# Patient Record
Sex: Female | Born: 1995 | Race: White | Hispanic: No | Marital: Single | Smoking: Current every day smoker
Health system: Southern US, Community
[De-identification: ages and names within clinical notes are randomized; demographics above are authoritative.]

## PROBLEM LIST (undated history)

## (undated) DIAGNOSIS — N83209 Unspecified ovarian cyst, unspecified side: Secondary | ICD-10-CM

## (undated) HISTORY — PX: WISDOM TOOTH EXTRACTION: SHX21

## (undated) HISTORY — DX: Unspecified ovarian cyst, unspecified side: N83.209

---

## 2014-09-06 ENCOUNTER — Observation Stay (HOSPITAL_COMMUNITY)
Admission: EM | Admit: 2014-09-06 | Discharge: 2014-09-07 | Disposition: A | Discharge status: Other Acute Inpatient Hospital | Payer: BLUE CROSS/BLUE SHIELD | Attending: Obstetrics & Gynecology | Admitting: Obstetrics & Gynecology

## 2014-09-06 ENCOUNTER — Encounter (HOSPITAL_COMMUNITY): Payer: Self-pay | Admitting: Emergency Medicine

## 2014-09-06 DIAGNOSIS — K659 Peritonitis, unspecified: Secondary | ICD-10-CM

## 2014-09-06 DIAGNOSIS — R1031 Right lower quadrant pain: Secondary | ICD-10-CM | POA: Diagnosis present

## 2014-09-06 DIAGNOSIS — R112 Nausea with vomiting, unspecified: Secondary | ICD-10-CM | POA: Insufficient documentation

## 2014-09-06 DIAGNOSIS — R102 Pelvic and perineal pain: Secondary | ICD-10-CM

## 2014-09-06 DIAGNOSIS — N83209 Unspecified ovarian cyst, unspecified side: Secondary | ICD-10-CM | POA: Diagnosis present

## 2014-09-06 DIAGNOSIS — R000 Tachycardia, unspecified: Secondary | ICD-10-CM

## 2014-09-06 LAB — CBC WITH DIFFERENTIAL/PLATELET
Basophils Absolute: 0.2 10*3/uL — ABNORMAL HIGH (ref 0.0–0.1)
Basophils Relative: 1 % (ref 0–1)
EOS ABS: 0 10*3/uL (ref 0.0–0.7)
EOS PCT: 0 % (ref 0–5)
HCT: 39.3 % (ref 36.0–46.0)
Hemoglobin: 13.5 g/dL (ref 12.0–15.0)
LYMPHS ABS: 2.9 10*3/uL (ref 0.7–4.0)
Lymphocytes Relative: 13 % (ref 12–46)
MCH: 32.2 pg (ref 26.0–34.0)
MCHC: 34.4 g/dL (ref 30.0–36.0)
MCV: 93.8 fL (ref 78.0–100.0)
MONO ABS: 2 10*3/uL — AB (ref 0.1–1.0)
Monocytes Relative: 9 % (ref 3–12)
NEUTROS PCT: 77 % (ref 43–77)
Neutro Abs: 17 10*3/uL — ABNORMAL HIGH (ref 1.7–7.7)
Platelets: 249 10*3/uL (ref 150–400)
RBC: 4.19 MIL/uL (ref 3.87–5.11)
RDW: 12.3 % (ref 11.5–15.5)
WBC: 22.1 10*3/uL — ABNORMAL HIGH (ref 4.0–10.5)

## 2014-09-06 LAB — COMPREHENSIVE METABOLIC PANEL
ALBUMIN: 4.9 g/dL (ref 3.5–5.2)
ALT: 11 U/L (ref 0–35)
AST: 21 U/L (ref 0–37)
Alkaline Phosphatase: 39 U/L (ref 39–117)
Anion gap: 12 (ref 5–15)
BILIRUBIN TOTAL: 0.6 mg/dL (ref 0.3–1.2)
BUN: 8 mg/dL (ref 6–23)
CHLORIDE: 103 mmol/L (ref 96–112)
CO2: 24 mmol/L (ref 19–32)
Calcium: 9.3 mg/dL (ref 8.4–10.5)
Creatinine, Ser: 0.7 mg/dL (ref 0.50–1.10)
GFR calc Af Amer: >90 mL/min (ref >90)
Glucose, Bld: 116 mg/dL — ABNORMAL HIGH (ref 70–99)
POTASSIUM: 3.4 mmol/L — AB (ref 3.5–5.1)
Sodium: 139 mmol/L (ref 135–145)
Total Protein: 8 g/dL (ref 6.0–8.3)

## 2014-09-06 LAB — POC URINE PREG, ED: PREG TEST UR: NEGATIVE

## 2014-09-06 LAB — LIPASE, BLOOD: LIPASE: 17 U/L (ref 11–59)

## 2014-09-06 NOTE — ED Notes (Signed)
Pt reports N/V onset yesterday. Today pt began to have RLQ abdominal pain and continued N/V.

## 2014-09-06 NOTE — ED Notes (Signed)
Pt. Made aware for the need of urine. 

## 2014-09-07 ENCOUNTER — Encounter (HOSPITAL_COMMUNITY): Payer: Self-pay

## 2014-09-07 ENCOUNTER — Inpatient Hospital Stay (HOSPITAL_COMMUNITY): Payer: BLUE CROSS/BLUE SHIELD

## 2014-09-07 ENCOUNTER — Emergency Department (HOSPITAL_COMMUNITY): Payer: BLUE CROSS/BLUE SHIELD

## 2014-09-07 DIAGNOSIS — N8320 Unspecified ovarian cysts: Secondary | ICD-10-CM

## 2014-09-07 DIAGNOSIS — N83209 Unspecified ovarian cyst, unspecified side: Secondary | ICD-10-CM | POA: Diagnosis present

## 2014-09-07 LAB — URINALYSIS, ROUTINE W REFLEX MICROSCOPIC
BILIRUBIN URINE: NEGATIVE
Glucose, UA: NEGATIVE mg/dL
KETONES UR: 15 mg/dL — AB
Nitrite: NEGATIVE
PROTEIN: NEGATIVE mg/dL
SPECIFIC GRAVITY, URINE: 1.014 (ref 1.005–1.030)
Urobilinogen, UA: 0.2 mg/dL (ref 0.0–1.0)
pH: 5.5 (ref 5.0–8.0)

## 2014-09-07 LAB — CBC
HCT: 32.3 % — ABNORMAL LOW (ref 36.0–46.0)
HCT: 27.5 % — ABNORMAL LOW (ref 36.0–46.0)
HEMOGLOBIN: 11.1 g/dL — AB (ref 12.0–15.0)
Hemoglobin: 9.4 g/dL — ABNORMAL LOW (ref 12.0–15.0)
MCH: 31.8 pg (ref 26.0–34.0)
MCH: 31.6 pg (ref 26.0–34.0)
MCHC: 34.4 g/dL (ref 30.0–36.0)
MCHC: 34.2 g/dL (ref 30.0–36.0)
MCV: 92.6 fL (ref 78.0–100.0)
MCV: 92.6 fL (ref 78.0–100.0)
PLATELETS: 201 10*3/uL (ref 150–400)
PLATELETS: 165 10*3/uL (ref 150–400)
RBC: 3.49 MIL/uL — AB (ref 3.87–5.11)
RBC: 2.97 MIL/uL — ABNORMAL LOW (ref 3.87–5.11)
RDW: 12.5 % (ref 11.5–15.5)
RDW: 12.4 % (ref 11.5–15.5)
WBC: 17.1 10*3/uL — AB (ref 4.0–10.5)
WBC: 11.4 10*3/uL — ABNORMAL HIGH (ref 4.0–10.5)

## 2014-09-07 LAB — CBC WITH DIFFERENTIAL/PLATELET
Basophils Absolute: 0 10*3/uL (ref 0.0–0.1)
Basophils Relative: 0 % (ref 0–1)
Eosinophils Absolute: 0 10*3/uL (ref 0.0–0.7)
Eosinophils Relative: 0 % (ref 0–5)
HCT: 35.7 % — ABNORMAL LOW (ref 36.0–46.0)
Hemoglobin: 12.3 g/dL (ref 12.0–15.0)
Lymphocytes Relative: 5 % — ABNORMAL LOW (ref 12–46)
Lymphs Abs: 1.1 10*3/uL (ref 0.7–4.0)
MCH: 31.9 pg (ref 26.0–34.0)
MCHC: 34.5 g/dL (ref 30.0–36.0)
MCV: 92.7 fL (ref 78.0–100.0)
Monocytes Absolute: 1.1 10*3/uL — ABNORMAL HIGH (ref 0.1–1.0)
Monocytes Relative: 5 % (ref 3–12)
Neutro Abs: 19.9 10*3/uL — ABNORMAL HIGH (ref 1.7–7.7)
Neutrophils Relative %: 90 % — ABNORMAL HIGH (ref 43–77)
Platelets: 226 10*3/uL (ref 150–400)
RBC: 3.85 MIL/uL — ABNORMAL LOW (ref 3.87–5.11)
RDW: 12.3 % (ref 11.5–15.5)
WBC: 22.1 10*3/uL — ABNORMAL HIGH (ref 4.0–10.5)

## 2014-09-07 LAB — WET PREP, GENITAL
Clue Cells Wet Prep HPF POC: NONE SEEN
Trich, Wet Prep: NONE SEEN
Yeast Wet Prep HPF POC: NONE SEEN

## 2014-09-07 LAB — URINE MICROSCOPIC-ADD ON

## 2014-09-07 LAB — RPR: RPR: NONREACTIVE

## 2014-09-07 LAB — I-STAT BETA HCG BLOOD, ED (MC, WL, AP ONLY): I-stat hCG, quantitative: <5 m[IU]/mL (ref <5)

## 2014-09-07 LAB — HIV ANTIBODY (ROUTINE TESTING W REFLEX): HIV Screen 4th Generation wRfx: NONREACTIVE

## 2014-09-07 MED ORDER — OXYCODONE-ACETAMINOPHEN 5-325 MG PO TABS: 1.0000 | ORAL_TABLET | ORAL | Status: DC | PRN

## 2014-09-07 MED ORDER — OXYCODONE-ACETAMINOPHEN 5-325 MG PO TABS
1.0000 | ORAL_TABLET | ORAL | Status: DC | PRN
  Administered 2014-09-07 (×4): 1 via ORAL
  Filled 2014-09-07 (×4): qty 1

## 2014-09-07 MED ORDER — FENTANYL CITRATE 0.05 MG/ML IJ SOLN
25.0000 ug | Freq: Once | INTRAMUSCULAR | Status: AC
  Administered 2014-09-07: 25 ug via INTRAVENOUS
  Filled 2014-09-07: qty 2

## 2014-09-07 MED ORDER — ONDANSETRON HCL 4 MG PO TABS
4.0000 mg | ORAL_TABLET | Freq: Four times a day (QID) | ORAL | Status: DC | PRN
  Administered 2014-09-07: 4 mg via ORAL
  Filled 2014-09-07: qty 1

## 2014-09-07 MED ORDER — NORETHINDRONE ACET-ETHINYL EST 1-20 MG-MCG PO TABS: 1.0000 | ORAL_TABLET | Freq: Every day | ORAL | Status: DC

## 2014-09-07 MED ORDER — LACTATED RINGERS IV SOLN
INTRAVENOUS | Status: DC
  Administered 2014-09-07: 06:00:00 via INTRAVENOUS

## 2014-09-07 MED ORDER — ONDANSETRON HCL 4 MG/2ML IJ SOLN
4.0000 mg | Freq: Once | INTRAMUSCULAR | Status: AC
  Administered 2014-09-07: 4 mg via INTRAVENOUS
  Filled 2014-09-07: qty 2

## 2014-09-07 MED ORDER — IOHEXOL 300 MG/ML  SOLN
80.0000 mL | Freq: Once | INTRAMUSCULAR | Status: AC | PRN
  Administered 2014-09-07: 80 mL via INTRAVENOUS

## 2014-09-07 MED ORDER — DOCUSATE SODIUM 100 MG PO CAPS: 100.0000 mg | ORAL_CAPSULE | Freq: Two times a day (BID) | ORAL | Status: DC | PRN

## 2014-09-07 MED ORDER — ONDANSETRON HCL 4 MG/2ML IJ SOLN: 4.0000 mg | Freq: Four times a day (QID) | INTRAMUSCULAR | Status: DC | PRN

## 2014-09-07 MED ORDER — IBUPROFEN 400 MG PO TABS
400.0000 mg | ORAL_TABLET | Freq: Four times a day (QID) | ORAL | Status: DC | PRN
Start: 2014-09-07 — End: 2014-10-03

## 2014-09-07 MED ORDER — HYDROMORPHONE HCL 1 MG/ML IJ SOLN: 1.0000 mg | INTRAMUSCULAR | Status: DC | PRN

## 2014-09-07 MED ORDER — HYDROMORPHONE HCL 1 MG/ML IJ SOLN
1.0000 mg | Freq: Once | INTRAMUSCULAR | Status: AC
  Administered 2014-09-07: 1 mg via INTRAVENOUS
  Filled 2014-09-07: qty 1

## 2014-09-07 MED ORDER — SODIUM CHLORIDE 0.9 % IV BOLUS (SEPSIS)
1000.0000 mL | Freq: Once | INTRAVENOUS | Status: AC
  Administered 2014-09-07: 1000 mL via INTRAVENOUS

## 2014-09-07 MED ORDER — ACETAMINOPHEN 325 MG PO TABS: 650.0000 mg | ORAL_TABLET | ORAL | Status: DC | PRN

## 2014-09-07 MED ORDER — IOHEXOL 300 MG/ML  SOLN
50.0000 mL | Freq: Once | INTRAMUSCULAR | Status: AC | PRN
  Administered 2014-09-07: 50 mL via ORAL

## 2014-09-07 NOTE — ED Provider Notes (Signed)
Medical screening examination/treatment/procedure(s) were conducted as a shared visit with non-physician practitioner(s) and myself.  I personally evaluated the patient during the encounter.  Patient with peritonitis on examination.  CT scan consistent with large volume fluid likely blood from hemorrhagic right ovarian cyst.  Given her para his niacin exam in her ongoing tachycardia I think she would benefit from observation [facility] with gyne[facility] admission.  I'm concerned that she still has active bleeding and may benefit from operative management.  I discussed the case with Dr. Despina HiddenEure who accepts the patient to the [facility] MAU MR evaluated there.  Repeat hemoglobin ordered  CRITICAL CARE Performed by: Lyanne CoAMPOS,Malikai Gut M Total critical care time: 30 Critical care time was exclusive of separately billable procedures and treating other patients. Critical care was necessary to treat or prevent imminent or life-threatening deterioration. Critical care was time spent personally by me on the following activities: development of treatment plan with patient and/or surrogate as well as nursing, discussions with consultants, evaluation of patient's response to treatment, examination of patient, obtaining history from patient or surrogate, ordering and performing treatments and interventions, ordering and review of laboratory studies, ordering and review of radiographic studies, pulse oximetry and re-evaluation of patient's condition.  Ct Abdomen Pelvis W Contrast  09/07/2014   CLINICAL DATA:  Right lower quadrant abdominal pain with nausea and vomiting. Leukocytosis.  EXAM: CT ABDOMEN AND PELVIS WITH CONTRAST  TECHNIQUE: Multidetector CT imaging of the abdomen and pelvis was performed using the standard protocol following bolus administration of intravenous contrast.  CONTRAST:  50mL OMNIPAQUE IOHEXOL 300 MG/ML SOLN, 80mL OMNIPAQUE IOHEXOL 300 MG/ML SOLN  COMPARISON:  None.  FINDINGS:  There is a large volume of fluid within the peritoneum. The fluid is of moderately high attenuation, suggesting blood. There is a peripherally enhancing right adnexal 3 cm lesion which could be the source of the hemorrhage although this is not conclusive.  The appendix is normal. There are normal appearances of the liver, spleen, pancreas, adrenals and kidneys.  IMPRESSION: Large volume peritoneal fluid with high attenuation suggesting it may be blood. Other complicated fluid collections such as a large volume of purulent material may be considered. There is a peripherally enhancing 3 cm right adnexal lesion which is not characterized on this scan but could be the source of the blood or fluid. These results were called by telephone at the time of interpretation on 09/07/2014 at 1:48 am to Dr. Eyvonne MechanicJEFFREY HEDGES , who verbally acknowledged these results.   Electronically Signed   By: Ellery Plunkaniel R Mitchell M.D.   On: 09/07/2014 01:48    Results for orders placed or performed during the hospital encounter of 09/06/14  CBC with Differential  Result Value Ref Range   WBC 22.1 (H) 4.0 - 10.5 K/uL   RBC 4.19 3.87 - 5.11 MIL/uL   Hemoglobin 13.5 12.0 - 15.0 g/dL   HCT 16.139.3 09.636.0 - 04.546.0 %   MCV 93.8 78.0 - 100.0 fL   MCH 32.2 26.0 - 34.0 pg   MCHC 34.4 30.0 - 36.0 g/dL   RDW 40.912.3 81.111.5 - 91.415.5 %   Platelets 249 150 - 400 K/uL   Neutrophils Relative % 77 43 - 77 %   Lymphocytes Relative 13 12 - 46 %   Monocytes Relative 9 3 - 12 %   Eosinophils Relative 0 0 - 5 %   Basophils Relative 1 0 - 1 %   Neutro Abs 17.0 (H) 1.7 - 7.7 K/uL   Lymphs  Abs 2.9 0.7 - 4.0 K/uL   Monocytes Absolute 2.0 (H) 0.1 - 1.0 K/uL   Eosinophils Absolute 0.0 0.0 - 0.7 K/uL   Basophils Absolute 0.2 (H) 0.0 - 0.1 K/uL   Smear Review MORPHOLOGY UNREMARKABLE   Comprehensive metabolic panel  Result Value Ref Range   Sodium 139 135 - 145 mmol/L   Potassium 3.4 (L) 3.5 - 5.1 mmol/L   Chloride 103 96 - 112 mmol/L   CO2 24 19 - 32 mmol/L    Glucose, Bld 116 (H) 70 - 99 mg/dL   BUN 8 6 - 23 mg/dL   Creatinine, Ser 0.45 0.50 - 1.10 mg/dL   Calcium 9.3 8.4 - 40.9 mg/dL   Total Protein 8.0 6.0 - 8.3 g/dL   Albumin 4.9 3.5 - 5.2 g/dL   AST 21 0 - 37 U/L   ALT 11 0 - 35 U/L   Alkaline Phosphatase 39 39 - 117 U/L   Total Bilirubin 0.6 0.3 - 1.2 mg/dL   GFR calc non Af Amer >90 >90 mL/min   GFR calc Af Amer >90 >90 mL/min   Anion gap 12 5 - 15  Lipase, blood  Result Value Ref Range   Lipase 17 11 - 59 U/L  Urinalysis, Routine w reflex microscopic  Result Value Ref Range   Color, Urine YELLOW YELLOW   APPearance HAZY (A) CLEAR   Specific Gravity, Urine 1.014 1.005 - 1.030   pH 5.5 5.0 - 8.0   Glucose, UA NEGATIVE NEGATIVE mg/dL   Hgb urine dipstick SMALL (A) NEGATIVE   Bilirubin Urine NEGATIVE NEGATIVE   Ketones, ur 15 (A) NEGATIVE mg/dL   Protein, ur NEGATIVE NEGATIVE mg/dL   Urobilinogen, UA 0.2 0.0 - 1.0 mg/dL   Nitrite NEGATIVE NEGATIVE   Leukocytes, UA TRACE (A) NEGATIVE  Urine microscopic-add on  Result Value Ref Range   Squamous Epithelial / LPF RARE RARE   WBC, UA 3-6 <3 WBC/hpf   RBC / HPF 0-2 <3 RBC/hpf  CBC with Differential  Result Value Ref Range   WBC 22.1 (H) 4.0 - 10.5 K/uL   RBC 3.85 (L) 3.87 - 5.11 MIL/uL   Hemoglobin 12.3 12.0 - 15.0 g/dL   HCT 81.1 (L) 91.4 - 78.2 %   MCV 92.7 78.0 - 100.0 fL   MCH 31.9 26.0 - 34.0 pg   MCHC 34.5 30.0 - 36.0 g/dL   RDW 95.6 21.3 - 08.6 %   Platelets 226 150 - 400 K/uL   Neutrophils Relative % 90 (H) 43 - 77 %   Lymphocytes Relative 5 (L) 12 - 46 %   Monocytes Relative 5 3 - 12 %   Eosinophils Relative 0 0 - 5 %   Basophils Relative 0 0 - 1 %   Neutro Abs 19.9 (H) 1.7 - 7.7 K/uL   Lymphs Abs 1.1 0.7 - 4.0 K/uL   Monocytes Absolute 1.1 (H) 0.1 - 1.0 K/uL   Eosinophils Absolute 0.0 0.0 - 0.7 K/uL   Basophils Absolute 0.0 0.0 - 0.1 K/uL   WBC Morphology ATYPICAL LYMPHOCYTES   POC Urine Pregnancy, ED  (If Pre-menopausal female) - do not order at [facility]   Result Value Ref Range   Preg Test, Ur NEGATIVE NEGATIVE  I-Stat Beta hCG blood, ED ([facility], [facility], [facility] only)  Result Value Ref Range   I-stat hCG, quantitative <5.0 <5 mIU/mL   Comment 3              Azalia Bilis, MD 09/07/14  [id number] 

## 2014-09-07 NOTE — ED Provider Notes (Signed)
  Face-to-face evaluation   History: Onset vomiting yesterday, today she developed diffuse abdominal pain. She denies diarrhea, cough, shortness of breath or chest pain. She has felt hot but not taken her temperature.  Physical exam: Alert, somewhat anxious, comfortable. Abdomen normal bowel sounds, soft, tender to light touch in all quadrants.  Medical screening examination/treatment/procedure(s) were conducted as a shared visit with non-physician practitioner(s) and myself.  I personally evaluated the patient during the encounter  Mancel BaleElliott Cantrell Martus, MD 09/07/14 [phone]

## 2014-09-07 NOTE — Progress Notes (Signed)
HD #1 Hemorrhagic right corpus luteum cyst Subjective: Patient reports resolution of her nausea and vomiting and less pain although still present.    Objective: I have reviewed patient's vital signs, intake and output, medications, labs and imaging reports and sonogram images.  GI: soft scapphoid no guarding moderate tenderness, seemingly improved exam   Assessment/Plan: Ruptured hemorrhagic corpus luteum, right Hemodynamically stable  Recheck CBC this afternoon Continue pain management and transition to oral Anticipate discharge later today      Raylinn Kosar H 09/07/2014, 7:52 AM

## 2014-09-07 NOTE — MAU Note (Signed)
[facility]  Pt reports low abd pain on right side but now is all over mid to low abd area.  Denies bleeding or discharge.

## 2014-09-07 NOTE — H&P (Signed)
Expand All Collapse All    History     CSN: [id number]  Arrival date and time: 09/06/14 2201  First Provider Initiated Contact with Patient 09/07/14 [phone]    Chief Complaint  Patient presents with  . Abdominal Pain   HPI This is a 19 y.o. female who was transferred here from ED for probable ruptured ovarian cyst with large amount of blood in belly. Hemoglobin dropped a small amount, but patient had received IV hydration. They felt she was in need of hospitalization and possibly surgery. Dr Despina Hidden accepted transfer.   ER Notes: 19 year old GOPO female presents today with abdominal pain, nausea, vomiting. Patient reports the symptoms started yesterday in her right lower quadrant, described as a sharp pain. She reports the pain progressed spreading throughout her abdomen with multiple episodes of nonbloody emesis yesterday. She reports continued pain today, with more bouts of emesis. She reports normal bowel bladder function normal stool this morning. No close sick contacts, no exposure to abnormal food or drink. Patient reports that the pain is worse with palpation, movement, walking. No alleviating factors. No history of abdominal surgery. Reports she is sexually active but does not have any vaginal complaints. Last oral intake to 2 p.m. Today. Patient denies taking any medications, has not tried over-the-counter therapies.  Patient with peritonitis on examination. CT scan consistent with large volume fluid likely blood from hemorrhagic right ovarian cyst. Given her para his niacin exam in her ongoing tachycardia I think she would benefit from observation overnight at [facility] with gynecology admission. I'm concerned that she still has active bleeding and may benefit from operative management. I discussed the case with Dr. Despina Hidden who accepts the patient to the [facility] MAU MR evaluated there. Repeat hemoglobin ordered  OB History    Gravida Para Term  Preterm AB TAB SAB Ectopic Multiple Living        History reviewed. No pertinent past medical history.  Past Surgical History  Procedure Laterality Date  . Wisdom tooth extraction    . Wisdom tooth extraction      History reviewed. No pertinent family history.  History  Substance Use Topics  . Smoking status: Current Every Day Smoker -- 1.00 packs/day  . Smokeless tobacco: Not on file  . Alcohol Use: Yes     Comment: socially     Allergies: No Known Allergies  Prescriptions prior to admission  Medication Sig Dispense Refill Last Dose  . ibuprofen (ADVIL,MOTRIN) 50 MG chewable tablet Chew by mouth every 8 (eight) hours as needed for fever.   09/06/2014 at Unknown time    Review of Systems  Constitutional: Negative for fever, chills and malaise/fatigue.  Gastrointestinal: Positive for abdominal pain. Negative for nausea, vomiting, diarrhea and constipation.  Genitourinary: Negative for dysuria.  Musculoskeletal: Negative for myalgias and back pain.  Neurological: Negative for dizziness.   Physical Exam   Blood pressure 106/61, pulse 79, temperature 98.5 F (36.9 C), temperature source Oral, resp. rate 16, height  (1.651 m), weight 98 lb (44.453 kg), SpO2 100 %.  Physical Exam  Constitutional: She is oriented to person, place, and time. She appears well-developed and well-nourished. No distress (but in pain).  HENT:  Head: Normocephalic.  Neck: Normal range of motion.  Cardiovascular: Normal rate and regular rhythm. Exam reveals no gallop and no friction rub.  No murmur heard. Respiratory: Effort normal and breath sounds normal. No respiratory distress. She has no wheezes.  She has no rales. She exhibits no tenderness.  GI: Soft. She exhibits distension (somewhat tense and distended). She exhibits no mass. There is tenderness. There is rebound and guarding.  Genitourinary: Vagina  normal. No vaginal discharge found.  Cultures obtained + cervical motion tenderness Uterus tender, but difficult to differentiate due to generalized abdominal guarding  Musculoskeletal: Normal range of motion.  Neurological: She is alert and oriented to person, place, and time.  Skin: Skin is warm and dry.  Psychiatric: She has a normal mood and affect.    MAU Course  Procedures  MDM Pelvic exam done. Cultures obtained. Will get US to further elucidate intrabdominal findings. Consulted Dr Despina HiddenEure who agrees.   Imaging Results    [place] Transvaginal Non-ob  09/07/2014 CLINICAL DATA: Acute onset of right lower quadrant abdominal pain and chronic amenorrhea. Leukocytosis. Initial encounter. EXAM: TRANSABDOMINAL AND TRANSVAGINAL ULTRASOUND OF PELVIS TECHNIQUE: Both transabdominal and transvaginal ultrasound examinations of the pelvis were performed. Transabdominal technique was performed for global imaging of the pelvis including uterus, ovaries, adnexal regions, and pelvic cul-de-sac. It was necessary to proceed with endovaginal exam following the transabdominal exam to visualize the uterus and ovaries in greater detail. COMPARISON: CT of the abdomen and pelvis performed earlier today at 12:56 a.m. FINDINGS: Uterus Measurements: 8.2 x 3.5 x 4.1 cm. No fibroids or other mass visualized. Endometrium Thickness: 0.9 cm. No focal abnormality visualized. Right ovary Measurements: 4.4 x 3.0 x 3.8 cm. A 3.0 cm somewhat complex cyst is noted at the right ovary, with a mildly lace-like pattern, possibly reflecting a hemorrhagic cyst. Left ovary Measurements: 3.3 x 2.0 x 2.1 cm. Normal appearance/no adnexal mass. Other findings A large amount of complex fluid is noted within the pelvis, with several areas of clot seen. IMPRESSION: Large amount of complex fluid again noted within the pelvis, concerning for blood, with several areas of clot noted. Somewhat complex 3.0 cm cyst at the right ovary may  reflect a hemorrhagic cyst. This could reflect rupture of a hemorrhagic cyst; the amount of visualized blood is rather large for a hemorrhagic cyst. These results were called by telephone at the time of interpretation on 09/07/2014 at 5:16 am to [facility] LeMARIE Koda Defrank CNM, who verbally acknowledged these results. Electronically Signed By: Roanna RaiderJeffery Chang M.D. On: 09/07/2014 05:17   [place] Pelvis Complete  09/07/2014 CLINICAL DATA: Acute onset of right lower quadrant abdominal pain and chronic amenorrhea. Leukocytosis. Initial encounter. EXAM: TRANSABDOMINAL AND TRANSVAGINAL ULTRASOUND OF PELVIS TECHNIQUE: Both transabdominal and transvaginal ultrasound examinations of the pelvis were performed. Transabdominal technique was performed for global imaging of the pelvis including uterus, ovaries, adnexal regions, and pelvic cul-de-sac. It was necessary to proceed with endovaginal exam following the transabdominal exam to visualize the uterus and ovaries in greater detail. COMPARISON: CT of the abdomen and pelvis performed earlier today at 12:56 a.m. FINDINGS: Uterus Measurements: 8.2 x 3.5 x 4.1 cm. No fibroids or other mass visualized. Endometrium Thickness: 0.9 cm. No focal abnormality visualized. Right ovary Measurements: 4.4 x 3.0 x 3.8 cm. A 3.0 cm somewhat complex cyst is noted at the right ovary, with a mildly lace-like pattern, possibly reflecting a hemorrhagic cyst. Left ovary Measurements: 3.3 x 2.0 x 2.1 cm. Normal appearance/no adnexal mass. Other findings A large amount of complex fluid is noted within the pelvis, with several areas of clot seen. IMPRESSION: Large amount of complex fluid again noted within the pelvis, concerning for blood, with several areas of clot noted. Somewhat complex 3.0 cm cyst at the right  ovary may reflect a hemorrhagic cyst. This could reflect rupture of a hemorrhagic cyst; the amount of visualized blood is rather large for a hemorrhagic cyst. These results were  called by telephone at the time of interpretation on 09/07/2014 at 5:16 am to [facility] CNM, who verbally acknowledged these results. Electronically Signed By: Roanna Raider M.D. On: 09/07/2014 05:17   Ct Abdomen Pelvis W Contrast  09/07/2014 CLINICAL DATA: Right lower quadrant abdominal pain with nausea and vomiting. Leukocytosis. EXAM: CT ABDOMEN AND PELVIS WITH CONTRAST TECHNIQUE: Multidetector CT imaging of the abdomen and pelvis was performed using the standard protocol following bolus administration of intravenous contrast. CONTRAST: 50mL OMNIPAQUE IOHEXOL 300 MG/ML SOLN, 80mL OMNIPAQUE IOHEXOL 300 MG/ML SOLN COMPARISON: None. FINDINGS: There is a large volume of fluid within the peritoneum. The fluid is of moderately high attenuation, suggesting blood. There is a peripherally enhancing right adnexal 3 cm lesion which could be the source of the hemorrhage although this is not conclusive. The appendix is normal. There are normal appearances of the liver, spleen, pancreas, adrenals and kidneys. IMPRESSION: Large volume peritoneal fluid with high attenuation suggesting it may be blood. Other complicated fluid collections such as a large volume of purulent material may be considered. There is a peripherally enhancing 3 cm right adnexal lesion which is not characterized on this scan but could be the source of the blood or fluid. These results were called by telephone at the time of interpretation on 09/07/2014 at 1:48 am to Dr. Eyvonne Mechanic , who verbally acknowledged these results. Electronically Signed By: Ellery Plunk M.D. On: 09/07/2014 01:48     Lab Results Last 24 Hours    Results for orders placed or performed during the hospital encounter of 09/06/14 (from the past 24 hour(s))  CBC with Differential Status: Abnormal   Collection Time: 09/06/14 10:58 PM  Result Value Ref Range   WBC 22.1 (H) 4.0 - 10.5 K/uL   RBC 4.19 3.87 - 5.11 MIL/uL    Hemoglobin 13.5 12.0 - 15.0 g/dL   HCT 16.1 09.6 - 04.5 %   MCV 93.8 78.0 - 100.0 fL   MCH 32.2 26.0 - 34.0 pg   MCHC 34.4 30.0 - 36.0 g/dL   RDW 40.9 81.1 - 91.4 %   Platelets 249 150 - 400 K/uL   Neutrophils Relative % 77 43 - 77 %   Lymphocytes Relative 13 12 - 46 %   Monocytes Relative 9 3 - 12 %   Eosinophils Relative 0 0 - 5 %   Basophils Relative 1 0 - 1 %   Neutro Abs 17.0 (H) 1.7 - 7.7 K/uL   Lymphs Abs 2.9 0.7 - 4.0 K/uL   Monocytes Absolute 2.0 (H) 0.1 - 1.0 K/uL   Eosinophils Absolute 0.0 0.0 - 0.7 K/uL   Basophils Absolute 0.2 (H) 0.0 - 0.1 K/uL   Smear Review MORPHOLOGY UNREMARKABLE   Comprehensive metabolic panel Status: Abnormal   Collection Time: 09/06/14 10:58 PM  Result Value Ref Range   Sodium 139 135 - 145 mmol/L   Potassium 3.4 (L) 3.5 - 5.1 mmol/L   Chloride 103 96 - 112 mmol/L   CO2 24 19 - 32 mmol/L   Glucose, Bld 116 (H) 70 - 99 mg/dL   BUN 8 6 - 23 mg/dL   Creatinine, Ser 7.82 0.50 - 1.10 mg/dL   Calcium 9.3 8.4 - 95.6 mg/dL   Total Protein 8.0 6.0 - 8.3 g/dL   Albumin 4.9 3.5 - 5.2 g/dL  AST 21 0 - 37 U/L   ALT 11 0 - 35 U/L   Alkaline Phosphatase 39 39 - 117 U/L   Total Bilirubin 0.6 0.3 - 1.2 mg/dL   GFR calc non Af Amer >90 >90 mL/min   GFR calc Af Amer >90 >90 mL/min   Anion gap 12 5 - 15  Lipase, blood Status: None   Collection Time: 09/06/14 10:58 PM  Result Value Ref Range   Lipase 17 11 - 59 U/L  Urinalysis, Routine w reflex microscopic Status: Abnormal   Collection Time: 09/06/14 11:48 PM  Result Value Ref Range   Color, Urine YELLOW YELLOW   APPearance HAZY (A) CLEAR   Specific Gravity, Urine 1.014 1.005 - 1.030   pH 5.5 5.0 - 8.0   Glucose, UA NEGATIVE NEGATIVE mg/dL   Hgb urine dipstick SMALL (A) NEGATIVE   Bilirubin Urine  NEGATIVE NEGATIVE   Ketones, ur 15 (A) NEGATIVE mg/dL   Protein, ur NEGATIVE NEGATIVE mg/dL   Urobilinogen, UA 0.2 0.0 - 1.0 mg/dL   Nitrite NEGATIVE NEGATIVE   Leukocytes, UA TRACE (A) NEGATIVE  Urine microscopic-add on Status: None   Collection Time: 09/06/14 11:48 PM  Result Value Ref Range   Squamous Epithelial / LPF RARE RARE   WBC, UA 3-6 <3 WBC/hpf   RBC / HPF 0-2 <3 RBC/hpf  POC Urine Pregnancy, ED (If Pre-menopausal female) - do not order at [facility] Status: None   Collection Time: 09/06/14 11:53 PM  Result Value Ref Range   Preg Test, Ur NEGATIVE NEGATIVE  I-Stat Beta hCG blood, ED ([facility], [facility], [facility] only) Status: None   Collection Time: 09/07/14 12:13 AM  Result Value Ref Range   I-stat hCG, quantitative <5.0 <5 mIU/mL   Comment 3     CBC with Differential Status: Abnormal   Collection Time: 09/07/14 2:06 AM  Result Value Ref Range   WBC 22.1 (H) 4.0 - 10.5 K/uL   RBC 3.85 (L) 3.87 - 5.11 MIL/uL   Hemoglobin 12.3 12.0 - 15.0 g/dL   HCT 16.1 (L) 09.6 - 04.5 %   MCV 92.7 78.0 - 100.0 fL   MCH 31.9 26.0 - 34.0 pg   MCHC 34.5 30.0 - 36.0 g/dL   RDW 40.9 81.1 - 91.4 %   Platelets 226 150 - 400 K/uL   Neutrophils Relative % 90 (H) 43 - 77 %   Lymphocytes Relative 5 (L) 12 - 46 %   Monocytes Relative 5 3 - 12 %   Eosinophils Relative 0 0 - 5 %   Basophils Relative 0 0 - 1 %   Neutro Abs 19.9 (H) 1.7 - 7.7 K/uL   Lymphs Abs 1.1 0.7 - 4.0 K/uL   Monocytes Absolute 1.1 (H) 0.1 - 1.0 K/uL   Eosinophils Absolute 0.0 0.0 - 0.7 K/uL   Basophils Absolute 0.0 0.0 - 0.1 K/uL   WBC Morphology ATYPICAL LYMPHOCYTES   Wet prep, genital Status: Abnormal   Collection Time: 09/07/14 3:35 AM  Result Value Ref Range   Yeast Wet Prep HPF POC NONE SEEN NONE SEEN   Trich, Wet Prep NONE SEEN NONE SEEN   Clue  Cells Wet Prep HPF POC NONE SEEN NONE SEEN   WBC, Wet Prep HPF POC FEW (A) NONE SEEN  CBC Status: Abnormal   Collection Time: 09/07/14 3:55 AM  Result Value Ref Range   WBC 17.1 (H) 4.0 - 10.5 K/uL   RBC 3.49 (L) 3.87 - 5.11 MIL/uL   Hemoglobin 11.1 (  L) 12.0 - 15.0 g/dL   HCT 16.1 (L) 09.6 - 04.5 %   MCV 92.6 78.0 - 100.0 fL   MCH 31.8 26.0 - 34.0 pg   MCHC 34.4 30.0 - 36.0 g/dL   RDW 40.9 81.1 - 91.4 %   Platelets 201 150 - 400 K/uL       Assessment and Plan  A: Probable ruptured hemorrhagic ovarian cyst  Large amount of intraabdominal blood  P; Discussed with Dr Despina Hidden  Will admit for observation and IV fluids with analgesia  Reevaluate this evening or tomorrow morning. For stability  [facility] 09/07/2014, 3:49 AM

## 2014-09-07 NOTE — MAU Provider Note (Signed)
History     CSN: [id number]  Arrival date and time: 09/06/14 2201   First Provider Initiated Contact with Patient 09/07/14 [phone]      Chief Complaint  Patient presents with  . Abdominal Pain   HPI This is a 19 y.o. female who was transferred here from ED for probable ruptured ovarian cyst with large amount of blood in belly. Hemoglobin dropped a small amount, but patient had received IV hydration.   They felt she was in need of hospitalization and possibly surgery.  Dr Despina Hidden accepted transfer.   ER Notes: 19 year old GOPO female presents today with abdominal pain, nausea, vomiting. Patient reports the symptoms started yesterday in her right lower quadrant, described as a sharp pain. She reports the pain progressed spreading throughout her abdomen with multiple episodes of nonbloody emesis yesterday. She reports continued pain today, with more bouts of emesis. She reports normal bowel bladder function normal stool this morning. No close sick contacts, no exposure to abnormal food or drink. Patient reports that the pain is worse with palpation, movement, walking. No alleviating factors. No history of abdominal surgery. Reports she is sexually active but does not have any vaginal complaints. Last oral intake to 2 p.m. Today. Patient denies taking any medications, has not tried over-the-counter therapies.  Patient with peritonitis on examination. CT scan consistent with large volume fluid likely blood from hemorrhagic right ovarian cyst. Given her para his niacin exam in her ongoing tachycardia I think she would benefit from observation overnight at [facility] with gynecology admission. I'm concerned that she still has active bleeding and may benefit from operative management. I discussed the case with Dr. Despina Hidden who accepts the patient to the [facility] MAU MR evaluated there. Repeat hemoglobin ordered  OB History    Gravida Para Term Preterm AB TAB SAB Ectopic Multiple Living   0 0  0 0 0 0 0 0 0 0      History reviewed. No pertinent past medical history.  Past Surgical History  Procedure Laterality Date  . Wisdom tooth extraction    . Wisdom tooth extraction      History reviewed. No pertinent family history.  History  Substance Use Topics  . Smoking status: Current Every Day Smoker -- 1.00 packs/day  . Smokeless tobacco: Not on file  . Alcohol Use: Yes     Comment: socially     Allergies: No Known Allergies  Prescriptions prior to admission  Medication Sig Dispense Refill Last Dose  . ibuprofen (ADVIL,MOTRIN) 50 MG chewable tablet Chew by mouth every 8 (eight) hours as needed for fever.   09/06/2014 at Unknown time    Review of Systems  Constitutional: Negative for fever, chills and malaise/fatigue.  Gastrointestinal: Positive for abdominal pain. Negative for nausea, vomiting, diarrhea and constipation.  Genitourinary: Negative for dysuria.  Musculoskeletal: Negative for myalgias and back pain.  Neurological: Negative for dizziness.   Physical Exam   Blood pressure 106/61, pulse 79, temperature 98.5 F (36.9 C), temperature source Oral, resp. rate 16, height 5\' 5"  (1.651 m), weight 98 lb (44.453 kg), SpO2 100 %.  Physical Exam  Constitutional: She is oriented to person, place, and time. She appears well-developed and well-nourished. No distress (but in pain).  HENT:  Head: Normocephalic.  Neck: Normal range of motion.  Cardiovascular: Normal rate and regular rhythm.  Exam reveals no gallop and no friction rub.   No murmur heard. Respiratory: Effort normal and breath sounds normal. No respiratory distress. She has no  wheezes. She has no rales. She exhibits no tenderness.  GI: Soft. She exhibits distension (somewhat tense and distended). She exhibits no mass. There is tenderness. There is rebound and guarding.  Genitourinary: Vagina normal. No vaginal discharge found.  Cultures obtained + cervical motion tenderness Uterus tender, but difficult  to differentiate due to generalized abdominal guarding  Musculoskeletal: Normal range of motion.  Neurological: She is alert and oriented to person, place, and time.  Skin: Skin is warm and dry.  Psychiatric: She has a normal mood and affect.    MAU Course  Procedures  MDM Pelvic exam done. Cultures obtained. Will get US to further elucidate intrabdominal findings. Consulted Dr Despina HiddenEure who agrees.  [place] Transvaginal Non-ob  09/07/2014   CLINICAL DATA:  Acute onset of right lower quadrant abdominal pain and chronic amenorrhea. Leukocytosis. Initial encounter.  EXAM: TRANSABDOMINAL AND TRANSVAGINAL ULTRASOUND OF PELVIS  TECHNIQUE: Both transabdominal and transvaginal ultrasound examinations of the pelvis were performed. Transabdominal technique was performed for global imaging of the pelvis including uterus, ovaries, adnexal regions, and pelvic cul-de-sac. It was necessary to proceed with endovaginal exam following the transabdominal exam to visualize the uterus and ovaries in greater detail.  COMPARISON:  CT of the abdomen and pelvis performed earlier today at 12:56 a.m.  FINDINGS: Uterus  Measurements: 8.2 x 3.5 x 4.1 cm. No fibroids or other mass visualized.  Endometrium  Thickness: 0.9 cm.  No focal abnormality visualized.  Right ovary  Measurements: 4.4 x 3.0 x 3.8 cm. A 3.0 cm somewhat complex cyst is noted at the right ovary, with a mildly lace-like pattern, possibly reflecting a hemorrhagic cyst.  Left ovary  Measurements: 3.3 x 2.0 x 2.1 cm. Normal appearance/no adnexal mass.  Other findings  A large amount of complex fluid is noted within the pelvis, with several areas of clot seen.  IMPRESSION: Large amount of complex fluid again noted within the pelvis, concerning for blood, with several areas of clot noted. Somewhat complex 3.0 cm cyst at the right ovary may reflect a hemorrhagic cyst. This could reflect rupture of a hemorrhagic cyst; the amount of visualized blood is rather large for a hemorrhagic  cyst.  These results were called by telephone at the time of interpretation on 09/07/2014 at 5:16 am to [facility] Emilyrose Darrah CNM, who verbally acknowledged these results.   Electronically Signed   By: Roanna RaiderJeffery  Chang M.D.   On: 09/07/2014 05:17   [place] Pelvis Complete  09/07/2014   CLINICAL DATA:  Acute onset of right lower quadrant abdominal pain and chronic amenorrhea. Leukocytosis. Initial encounter.  EXAM: TRANSABDOMINAL AND TRANSVAGINAL ULTRASOUND OF PELVIS  TECHNIQUE: Both transabdominal and transvaginal ultrasound examinations of the pelvis were performed. Transabdominal technique was performed for global imaging of the pelvis including uterus, ovaries, adnexal regions, and pelvic cul-de-sac. It was necessary to proceed with endovaginal exam following the transabdominal exam to visualize the uterus and ovaries in greater detail.  COMPARISON:  CT of the abdomen and pelvis performed earlier today at 12:56 a.m.  FINDINGS: Uterus  Measurements: 8.2 x 3.5 x 4.1 cm. No fibroids or other mass visualized.  Endometrium  Thickness: 0.9 cm.  No focal abnormality visualized.  Right ovary  Measurements: 4.4 x 3.0 x 3.8 cm. A 3.0 cm somewhat complex cyst is noted at the right ovary, with a mildly lace-like pattern, possibly reflecting a hemorrhagic cyst.  Left ovary  Measurements: 3.3 x 2.0 x 2.1 cm. Normal appearance/no adnexal mass.  Other findings  A large amount of complex  fluid is noted within the pelvis, with several areas of clot seen.  IMPRESSION: Large amount of complex fluid again noted within the pelvis, concerning for blood, with several areas of clot noted. Somewhat complex 3.0 cm cyst at the right ovary may reflect a hemorrhagic cyst. This could reflect rupture of a hemorrhagic cyst; the amount of visualized blood is rather large for a hemorrhagic cyst.  These results were called by telephone at the time of interpretation on 09/07/2014 at 5:16 am to [facility] CNM, who verbally acknowledged these results.    Electronically Signed   By: Roanna Raider M.D.   On: 09/07/2014 05:17   Ct Abdomen Pelvis W Contrast  09/07/2014   CLINICAL DATA:  Right lower quadrant abdominal pain with nausea and vomiting. Leukocytosis.  EXAM: CT ABDOMEN AND PELVIS WITH CONTRAST  TECHNIQUE: Multidetector CT imaging of the abdomen and pelvis was performed using the standard protocol following bolus administration of intravenous contrast.  CONTRAST:  50mL OMNIPAQUE IOHEXOL 300 MG/ML SOLN, 80mL OMNIPAQUE IOHEXOL 300 MG/ML SOLN  COMPARISON:  None.  FINDINGS: There is a large volume of fluid within the peritoneum. The fluid is of moderately high attenuation, suggesting blood. There is a peripherally enhancing right adnexal 3 cm lesion which could be the source of the hemorrhage although this is not conclusive.  The appendix is normal. There are normal appearances of the liver, spleen, pancreas, adrenals and kidneys.  IMPRESSION: Large volume peritoneal fluid with high attenuation suggesting it may be blood. Other complicated fluid collections such as a large volume of purulent material may be considered. There is a peripherally enhancing 3 cm right adnexal lesion which is not characterized on this scan but could be the source of the blood or fluid. These results were called by telephone at the time of interpretation on 09/07/2014 at 1:48 am to Dr. Eyvonne Mechanic , who verbally acknowledged these results.   Electronically Signed   By: Ellery Plunk M.D.   On: 09/07/2014 01:48   Results for orders placed or performed during the hospital encounter of 09/06/14 (from the past 24 hour(s))  CBC with Differential     Status: Abnormal   Collection Time: 09/06/14 10:58 PM  Result Value Ref Range   WBC 22.1 (H) 4.0 - 10.5 K/uL   RBC 4.19 3.87 - 5.11 MIL/uL   Hemoglobin 13.5 12.0 - 15.0 g/dL   HCT 24.4 01.0 - 27.2 %   MCV 93.8 78.0 - 100.0 fL   MCH 32.2 26.0 - 34.0 pg   MCHC 34.4 30.0 - 36.0 g/dL   RDW 53.6 64.4 - 03.4 %   Platelets 249 150 -  400 K/uL   Neutrophils Relative % 77 43 - 77 %   Lymphocytes Relative 13 12 - 46 %   Monocytes Relative 9 3 - 12 %   Eosinophils Relative 0 0 - 5 %   Basophils Relative 1 0 - 1 %   Neutro Abs 17.0 (H) 1.7 - 7.7 K/uL   Lymphs Abs 2.9 0.7 - 4.0 K/uL   Monocytes Absolute 2.0 (H) 0.1 - 1.0 K/uL   Eosinophils Absolute 0.0 0.0 - 0.7 K/uL   Basophils Absolute 0.2 (H) 0.0 - 0.1 K/uL   Smear Review MORPHOLOGY UNREMARKABLE   Comprehensive metabolic panel     Status: Abnormal   Collection Time: 09/06/14 10:58 PM  Result Value Ref Range   Sodium 139 135 - 145 mmol/L   Potassium 3.4 (L) 3.5 - 5.1 mmol/L   Chloride  103 96 - 112 mmol/L   CO2 24 19 - 32 mmol/L   Glucose, Bld 116 (H) 70 - 99 mg/dL   BUN 8 6 - 23 mg/dL   Creatinine, Ser 1.61 0.50 - 1.10 mg/dL   Calcium 9.3 8.4 - 09.6 mg/dL   Total Protein 8.0 6.0 - 8.3 g/dL   Albumin 4.9 3.5 - 5.2 g/dL   AST 21 0 - 37 U/L   ALT 11 0 - 35 U/L   Alkaline Phosphatase 39 39 - 117 U/L   Total Bilirubin 0.6 0.3 - 1.2 mg/dL   GFR calc non Af Amer >90 >90 mL/min   GFR calc Af Amer >90 >90 mL/min   Anion gap 12 5 - 15  Lipase, blood     Status: None   Collection Time: 09/06/14 10:58 PM  Result Value Ref Range   Lipase 17 11 - 59 U/L  Urinalysis, Routine w reflex microscopic     Status: Abnormal   Collection Time: 09/06/14 11:48 PM  Result Value Ref Range   Color, Urine YELLOW YELLOW   APPearance HAZY (A) CLEAR   Specific Gravity, Urine 1.014 1.005 - 1.030   pH 5.5 5.0 - 8.0   Glucose, UA NEGATIVE NEGATIVE mg/dL   Hgb urine dipstick SMALL (A) NEGATIVE   Bilirubin Urine NEGATIVE NEGATIVE   Ketones, ur 15 (A) NEGATIVE mg/dL   Protein, ur NEGATIVE NEGATIVE mg/dL   Urobilinogen, UA 0.2 0.0 - 1.0 mg/dL   Nitrite NEGATIVE NEGATIVE   Leukocytes, UA TRACE (A) NEGATIVE  Urine microscopic-add on     Status: None   Collection Time: 09/06/14 11:48 PM  Result Value Ref Range   Squamous Epithelial / LPF RARE RARE   WBC, UA 3-6 <3 WBC/hpf   RBC / HPF  0-2 <3 RBC/hpf  POC Urine Pregnancy, ED  (If Pre-menopausal female) - do not order at [facility]     Status: None   Collection Time: 09/06/14 11:53 PM  Result Value Ref Range   Preg Test, Ur NEGATIVE NEGATIVE  I-Stat Beta hCG blood, ED ([facility], [facility], [facility] only)     Status: None   Collection Time: 09/07/14 12:13 AM  Result Value Ref Range   I-stat hCG, quantitative <5.0 <5 mIU/mL   Comment 3          CBC with Differential     Status: Abnormal   Collection Time: 09/07/14  2:06 AM  Result Value Ref Range   WBC 22.1 (H) 4.0 - 10.5 K/uL   RBC 3.85 (L) 3.87 - 5.11 MIL/uL   Hemoglobin 12.3 12.0 - 15.0 g/dL   HCT 04.5 (L) 40.9 - 81.1 %   MCV 92.7 78.0 - 100.0 fL   MCH 31.9 26.0 - 34.0 pg   MCHC 34.5 30.0 - 36.0 g/dL   RDW 91.4 78.2 - 95.6 %   Platelets 226 150 - 400 K/uL   Neutrophils Relative % 90 (H) 43 - 77 %   Lymphocytes Relative 5 (L) 12 - 46 %   Monocytes Relative 5 3 - 12 %   Eosinophils Relative 0 0 - 5 %   Basophils Relative 0 0 - 1 %   Neutro Abs 19.9 (H) 1.7 - 7.7 K/uL   Lymphs Abs 1.1 0.7 - 4.0 K/uL   Monocytes Absolute 1.1 (H) 0.1 - 1.0 K/uL   Eosinophils Absolute 0.0 0.0 - 0.7 K/uL   Basophils Absolute 0.0 0.0 - 0.1 K/uL   WBC Morphology ATYPICAL LYMPHOCYTES   Wet  prep, genital     Status: Abnormal   Collection Time: 09/07/14  3:35 AM  Result Value Ref Range   Yeast Wet Prep HPF POC NONE SEEN NONE SEEN   Trich, Wet Prep NONE SEEN NONE SEEN   Clue Cells Wet Prep HPF POC NONE SEEN NONE SEEN   WBC, Wet Prep HPF POC FEW (A) NONE SEEN  CBC     Status: Abnormal   Collection Time: 09/07/14  3:55 AM  Result Value Ref Range   WBC 17.1 (H) 4.0 - 10.5 K/uL   RBC 3.49 (L) 3.87 - 5.11 MIL/uL   Hemoglobin 11.1 (L) 12.0 - 15.0 g/dL   HCT 91.4 (L) 78.2 - 95.6 %   MCV 92.6 78.0 - 100.0 fL   MCH 31.8 26.0 - 34.0 pg   MCHC 34.4 30.0 - 36.0 g/dL   RDW 21.3 08.6 - 57.8 %   Platelets 201 150 - 400 K/uL     Assessment and Plan  A:  Probable ruptured hemorrhagic ovarian cyst      Large  amount of intraabdominal blood  P;  Discussed with Dr Despina Hidden       Will admit for observation and IV fluids with analgesia       Reevaluate this evening or tomorrow morning. For stability  [facility] 09/07/2014, 3:49 AM

## 2014-09-07 NOTE — ED Notes (Signed)
[facility] called for transport d/t extended delay of carelink

## 2014-09-07 NOTE — ED Provider Notes (Signed)
CSN: [id number]     Arrival date & time 09/06/14  2201 History   First MD Initiated Contact with Patient 09/06/14 2258     Chief Complaint  Patient presents with  . Abdominal Pain    HPI   19 year old GOPO female presents today with abdominal pain, nausea, vomiting. Patient reports the symptoms started yesterday in her right lower quadrant, described as a sharp pain. She reports the pain progressed spreading throughout her abdomen with multiple episodes of nonbloody emesis yesterday. She reports continued pain today, with more bouts of emesis. She reports normal bowel bladder function normal stool this morning. No close sick contacts, no exposure to abnormal food or drink. Patient reports that the pain is worse with palpation, movement, walking. No alleviating factors. No history of abdominal surgery. Reports she is sexually active but does not have any vaginal complaints. Last oral intake to 2 p.m. Today. Patient denies taking any medications, has not tried over-the-counter therapies.  History reviewed. No pertinent past medical history. Past Surgical History  Procedure Laterality Date  . Wisdom tooth extraction     No family history on file. History  Substance Use Topics  . Smoking status: Current Every Day Smoker -- 1.00 packs/day  . Smokeless tobacco: Not on file  . Alcohol Use: Yes   OB History    No data available     Review of Systems  All other systems reviewed and are negative.  Allergies  Review of patient's allergies indicates no known allergies.  Home Medications   Prior to Admission medications   Not on File   BP 126/93 mmHg  Pulse 113  Temp(Src) 97.9 F (36.6 C) (Oral)  Resp 18  Ht 5\' 5"  (1.651 m)  Wt 98 lb (44.453 kg)  BMI 16.31 kg/m2  SpO2 97%  LMP  Physical Exam  Constitutional: She is oriented to person, place, and time. She appears well-developed and well-nourished.  HENT:  Head: Normocephalic and atraumatic.  Eyes: Pupils are equal, round, and  reactive to light.  Neck: Normal range of motion. Neck supple. No JVD present. No tracheal deviation present. No thyromegaly present.  Cardiovascular: Regular rhythm, normal heart sounds and intact distal pulses.  Exam reveals no gallop and no friction rub.   No murmur heard. Pulmonary/Chest: Effort normal and breath sounds normal. No stridor. No respiratory distress. She has no wheezes. She has no rales. She exhibits no tenderness.  Abdominal: Normal appearance. There is generalized tenderness. There is rigidity, guarding and tenderness at McBurney's point.  Diffuse abdominal tenderness  Musculoskeletal: Normal range of motion.  Lymphadenopathy:    She has no cervical adenopathy.  Neurological: She is alert and oriented to person, place, and time. Coordination normal.  Skin: Skin is warm and dry.  Psychiatric: She has a normal mood and affect. Her behavior is normal. Judgment and thought content normal.  Nursing note and vitals reviewed.   ED Course  Procedures (including critical care time) Labs Review Labs Reviewed  CBC WITH DIFFERENTIAL/PLATELET - Abnormal; Notable for the following:    WBC 22.1 (*)    Neutro Abs 17.0 (*)    Monocytes Absolute 2.0 (*)    Basophils Absolute 0.2 (*)    All other components within normal limits  COMPREHENSIVE METABOLIC PANEL - Abnormal; Notable for the following:    Potassium 3.4 (*)    Glucose, Bld 116 (*)    All other components within normal limits  URINALYSIS, ROUTINE W REFLEX MICROSCOPIC - Abnormal; Notable for the following:  APPearance HAZY (*)    Hgb urine dipstick SMALL (*)    Ketones, ur 15 (*)    Leukocytes, UA TRACE (*)    All other components within normal limits  LIPASE, BLOOD  URINE MICROSCOPIC-ADD ON  POC URINE PREG, ED  I-STAT BETA HCG BLOOD, ED ([facility], [facility], [facility] ONLY)    Imaging Review No results found.   EKG Interpretation None     MDM   Final diagnoses:  Hemorrhagic cyst of ovary  Peritonitis  Tachycardia     Labs: I-STAT beta-hCG negative, urine pregnant, urinalysis trace leuks, CBC showed WBC with 22.1, CMP  Imaging: CT abdomen  Large volume peritoneal fluid with high attenuation suggesting it may be blood. Other complicated fluid collections such as a large volume of purulent material may be considered. There is a peripherally enhancing 3 cm right adnexal lesion which is not characterized on this scan but could be the source of the blood or fluid.  Consults: OB  Therapeutics: Fentanyl, NaCl  Assessment: Ruptured Ovarian Cyst  Plan: Patient likely presents with acute rupture of an ovarian cyst with negative amounts of abdominal bleeding. She was treated acutely with fluids, pain medication, and repeat labs. Patient's care was shared with Dr. Azalia Bilis who consult did Dr.Euri at the MAU who agreed to accept the patient. Patient's vitals remained stable and she remained persistently tachycardic.        Eyvonne Mechanic, PA-C 09/07/14 0244  Azalia Bilis, MD 09/07/14 0700

## 2014-09-07 NOTE — Discharge Summary (Signed)
Physician Discharge Summary  Patient ID: Lindsay Freeman MRN: [id number] DOB/AGE: 1995-07-03 19 y.o.  Admit date: 09/06/2014 Discharge date: 09/07/2014  Admission and Discharge Diagnoss:  Ruptured hemorrhagic ovarian cyst  Discharged Condition: stable  Hospital Course: Admitted for observation after ruptured ovarian cyst was noted.  Stable hemoglobin (received a lot of IV fluids), no signs of ongoing bleeding.  Vital signs remained stable, benign abdomen, resolved nausea Prescribed OCPs. Discharged to home with plan to follow up in clinic.  Significant Diagnostic Studies:  CBC Latest Ref Rng 09/07/2014 09/07/2014 09/07/2014  WBC 4.0 - 10.5 K/uL 11.4(H) 17.1(H) 22.1(H)  Hemoglobin 12.0 - 15.0 g/dL 4.0(J) 11.1(L) 12.3  Hematocrit 36.0 - 46.0 % 27.5(L) 32.3(L) 35.7(L)  Platelets 150 - 400 K/uL 165 201 226    US Transvaginal Non-ob  09/07/2014   CLINICAL DATA:  Acute onset of right lower quadrant abdominal pain and chronic amenorrhea. Leukocytosis. Initial encounter.  EXAM: TRANSABDOMINAL AND TRANSVAGINAL ULTRASOUND OF PELVIS  TECHNIQUE: Both transabdominal and transvaginal ultrasound examinations of the pelvis were performed. Transabdominal technique was performed for global imaging of the pelvis including uterus, ovaries, adnexal regions, and pelvic cul-de-sac. It was necessary to proceed with endovaginal exam following the transabdominal exam to visualize the uterus and ovaries in greater detail.  COMPARISON:  CT of the abdomen and pelvis performed earlier today at 12:56 a.m.  FINDINGS: Uterus  Measurements: 8.2 x 3.5 x 4.1 cm. No fibroids or other mass visualized.  Endometrium  Thickness: 0.9 cm.  No focal abnormality visualized.  Right ovary  Measurements: 4.4 x 3.0 x 3.8 cm. A 3.0 cm somewhat complex cyst is noted at the right ovary, with a mildly lace-like pattern, possibly reflecting a hemorrhagic cyst.  Left ovary  Measurements: 3.3 x 2.0 x 2.1 cm. Normal appearance/no adnexal mass.  Other findings   A large amount of complex fluid is noted within the pelvis, with several areas of clot seen.  IMPRESSION: Large amount of complex fluid again noted within the pelvis, concerning for blood, with several areas of clot noted. Somewhat complex 3.0 cm cyst at the right ovary may reflect a hemorrhagic cyst. This could reflect rupture of a hemorrhagic cyst; the amount of visualized blood is rather large for a hemorrhagic cyst.  These results were called by telephone at the time of interpretation on 09/07/2014 at 5:16 am to [facility] CNM, who verbally acknowledged these results.   Electronically Signed   By: Roanna Raider M.D.   On: 09/07/2014 05:17   US Pelvis Complete  09/07/2014   CLINICAL DATA:  Acute onset of right lower quadrant abdominal pain and chronic amenorrhea. Leukocytosis. Initial encounter.  EXAM: TRANSABDOMINAL AND TRANSVAGINAL ULTRASOUND OF PELVIS  TECHNIQUE: Both transabdominal and transvaginal ultrasound examinations of the pelvis were performed. Transabdominal technique was performed for global imaging of the pelvis including uterus, ovaries, adnexal regions, and pelvic cul-de-sac. It was necessary to proceed with endovaginal exam following the transabdominal exam to visualize the uterus and ovaries in greater detail.  COMPARISON:  CT of the abdomen and pelvis performed earlier today at 12:56 a.m.  FINDINGS: Uterus  Measurements: 8.2 x 3.5 x 4.1 cm. No fibroids or other mass visualized.  Endometrium  Thickness: 0.9 cm.  No focal abnormality visualized.  Right ovary  Measurements: 4.4 x 3.0 x 3.8 cm. A 3.0 cm somewhat complex cyst is noted at the right ovary, with a mildly lace-like pattern, possibly reflecting a hemorrhagic cyst.  Left ovary  Measurements: 3.3 x 2.0 x 2.1 cm.  Normal appearance/no adnexal mass.  Other findings  A large amount of complex fluid is noted within the pelvis, with several areas of clot seen.  IMPRESSION: Large amount of complex fluid again noted within the pelvis,  concerning for blood, with several areas of clot noted. Somewhat complex 3.0 cm cyst at the right ovary may reflect a hemorrhagic cyst. This could reflect rupture of a hemorrhagic cyst; the amount of visualized blood is rather large for a hemorrhagic cyst.  These results were called by telephone at the time of interpretation on 09/07/2014 at 5:16 am to [facility] CNM, who verbally acknowledged these results.   Electronically Signed   By: Roanna RaiderJeffery  Chang M.D.   On: 09/07/2014 05:17   Ct Abdomen Pelvis W Contrast  09/07/2014   CLINICAL DATA:  Right lower quadrant abdominal pain with nausea and vomiting. Leukocytosis.  EXAM: CT ABDOMEN AND PELVIS WITH CONTRAST  TECHNIQUE: Multidetector CT imaging of the abdomen and pelvis was performed using the standard protocol following bolus administration of intravenous contrast.  CONTRAST:  50mL OMNIPAQUE IOHEXOL 300 MG/ML SOLN, 80mL OMNIPAQUE IOHEXOL 300 MG/ML SOLN  COMPARISON:  None.  FINDINGS: There is a large volume of fluid within the peritoneum. The fluid is of moderately high attenuation, suggesting blood. There is a peripherally enhancing right adnexal 3 cm lesion which could be the source of the hemorrhage although this is not conclusive.  The appendix is normal. There are normal appearances of the liver, spleen, pancreas, adrenals and kidneys.  IMPRESSION: Large volume peritoneal fluid with high attenuation suggesting it may be blood. Other complicated fluid collections such as a large volume of purulent material may be considered. There is a peripherally enhancing 3 cm right adnexal lesion which is not characterized on this scan but could be the source of the blood or fluid. These results were called by telephone at the time of interpretation on 09/07/2014 at 1:48 am to Dr. Eyvonne MechanicJEFFREY HEDGES , who verbally acknowledged these results.   Electronically Signed   By: Ellery Plunkaniel R Mitchell M.D.   On: 09/07/2014 01:48   Treatments: IV hydration  Discharge Exam: Blood pressure  102/52, pulse 52, temperature 98.3 F (36.8 C), temperature source Oral, resp. rate 20, height 5\' 5"  (1.651 m), weight 98 lb (44.453 kg), SpO2 99 %. General appearance: alert and no distress GI: soft, mild tenderness to palpation, no rebound, no guarding Pelvic: deferred Extremities: extremities normal, atraumatic, no cyanosis or edema and Homans sign is negative, no sign of DVT Neurologic: Alert and oriented X 3, normal strength and tone. Normal symmetric reflexes. Normal coordination and gait  Disposition: Home     Medication List    STOP taking these medications        ibuprofen 50 MG chewable tablet  Commonly known as:  ADVIL,MOTRIN  Replaced by:  ibuprofen 400 MG tablet      TAKE these medications        docusate sodium 100 MG capsule  Commonly known as:  COLACE  Take 1 capsule (100 mg total) by mouth 2 (two) times daily as needed.     ibuprofen 400 MG tablet  Commonly known as:  ADVIL,MOTRIN  Take 1 tablet (400 mg total) by mouth every 6 (six) hours as needed for mild pain or moderate pain.     norethindrone-ethinyl estradiol 1-20 MG-MCG tablet  Commonly known as:  JUNEL 1/20  Take 1 tablet by mouth daily.     oxyCODONE-acetaminophen 5-325 MG per tablet  Commonly known as:  PERCOCET/ROXICET  Take 1-2 tablets by mouth every 3 (three) hours as needed (moderate to severe pain (when tolerating fluids)).       Follow-up Information    Follow up with [facility] In 3 weeks.   Why:  Follow up admission for ruptured ovarian cyst. Call clinic/come to [facility] for any concerning issues   Contact information:   [address] [place] [address] [phone]      Signed: Tereso Newcomer, MD 09/07/2014, 3:39 PM

## 2014-09-07 NOTE — Progress Notes (Signed)
Pt. D/c home stable with friend ambulatory to private car. D/c instructions and prescriptions reviewed with pt. Pt verbalized understanding. Pt will f/u in clinic in 3 weeks.

## 2014-09-07 NOTE — Discharge Instructions (Signed)
Ovarian Cyst An ovarian cyst is a fluid-filled sac that forms on an ovary. The ovaries are small organs that produce eggs in women. Various types of cysts can form on the ovaries. Most are not cancerous. Many do not cause problems, and they often go away on their own. Some may cause symptoms and require treatment. Common types of ovarian cysts include:  Functional cysts--These cysts may occur every month during the menstrual cycle. This is normal. The cysts usually go away with the next menstrual cycle if the woman does not get pregnant. Usually, there are no symptoms with a functional cyst.  Endometrioma cysts--These cysts form from the tissue that lines the uterus. They are also called "chocolate cysts" because they become filled with blood that turns brown. This type of cyst can cause pain in the lower abdomen during intercourse and with your menstrual period.  Cystadenoma cysts--This type develops from the cells on the outside of the ovary. These cysts can get very big and cause lower abdomen pain and pain with intercourse. This type of cyst can twist on itself, cut off its blood supply, and cause severe pain. It can also easily rupture and cause a lot of pain.  Dermoid cysts--This type of cyst is sometimes found in both ovaries. These cysts may contain different kinds of body tissue, such as skin, teeth, hair, or cartilage. They usually do not cause symptoms unless they get very big.  Theca lutein cysts--These cysts occur when too much of a certain hormone (human chorionic gonadotropin) is produced and overstimulates the ovaries to produce an egg. This is most common after procedures used to assist with the conception of a baby (in vitro fertilization). CAUSES   Fertility drugs can cause a condition in which multiple large cysts are formed on the ovaries. This is called ovarian hyperstimulation syndrome.  A condition called polycystic ovary syndrome can cause hormonal imbalances that can lead to  nonfunctional ovarian cysts. SIGNS AND SYMPTOMS  Many ovarian cysts do not cause symptoms. If symptoms are present, they may include:  Pelvic pain or pressure.  Pain in the lower abdomen.  Pain during sexual intercourse.  Increasing girth (swelling) of the abdomen.  Abnormal menstrual periods.  Increasing pain with menstrual periods.  Stopping having menstrual periods without being pregnant. DIAGNOSIS  These cysts are commonly found during a routine or annual pelvic exam. Tests may be ordered to find out more about the cyst. These tests may include:  Ultrasound.  X-ray of the pelvis.  CT scan.  MRI.  Blood tests. TREATMENT  Many ovarian cysts go away on their own without treatment. Your health care provider may want to check your cyst regularly for 2-3 months to see if it changes. For women in menopause, it is particularly important to monitor a cyst closely because of the higher rate of ovarian cancer in menopausal women. When treatment is needed, it may include any of the following:  A procedure to drain the cyst (aspiration). This may be done using a long needle and ultrasound. It can also be done through a laparoscopic procedure. This involves using a thin, lighted tube with a tiny camera on the end (laparoscope) inserted through a small incision.  Surgery to remove the whole cyst. This may be done using laparoscopic surgery or an open surgery involving a larger incision in the lower abdomen.  Hormone treatment or birth control pills. These methods are sometimes used to help dissolve a cyst. HOME CARE INSTRUCTIONS   Only take over-the-counter   or prescription medicines as directed by your health care provider.  Follow up with your health care provider as directed.  Get regular pelvic exams and Pap tests. SEEK MEDICAL CARE IF:   Your periods are late, irregular, or painful, or they stop.  Your pelvic pain or abdominal pain does not go away.  Your abdomen becomes  larger or swollen.  You have pressure on your bladder or trouble emptying your bladder completely.  You have pain during sexual intercourse.  You have feelings of fullness, pressure, or discomfort in your stomach.  You lose weight for no apparent reason.  You feel generally ill.  You become constipated.  You lose your appetite.  You develop acne.  You have an increase in body and facial hair.  You are gaining weight, without changing your exercise and eating habits.  You think you are pregnant. SEEK IMMEDIATE MEDICAL CARE IF:   You have increasing abdominal pain.  You feel sick to your stomach (nauseous), and you throw up (vomit).  You develop a fever that comes on suddenly.  You have abdominal pain during a bowel movement.  Your menstrual periods become heavier than usual. MAKE SURE YOU:  Understand these instructions.  Will watch your condition.  Will get help right away if you are not doing well or get worse. Document Released: 05/24/2005 Document Revised: 05/29/2013 Document Reviewed: 01/29/2013 ExitCare Patient Information 2015 [employer]. This information is not intended to replace advice given to you by your health care provider. Make sure you discuss any questions you have with your health care provider.  

## 2014-09-07 NOTE — Progress Notes (Signed)
Utilization Review completed.  

## 2014-09-09 LAB — GC/CHLAMYDIA PROBE AMP (~~LOC~~) NOT AT ARMC
CHLAMYDIA, DNA PROBE: NEGATIVE
Neisseria Gonorrhea: NEGATIVE

## 2014-10-03 ENCOUNTER — Encounter: Payer: Self-pay | Admitting: Obstetrics & Gynecology

## 2014-10-03 ENCOUNTER — Ambulatory Visit (INDEPENDENT_AMBULATORY_CARE_PROVIDER_SITE_OTHER): Payer: BLUE CROSS/BLUE SHIELD | Admitting: Obstetrics & Gynecology

## 2014-10-03 VITALS — BP 99/72 | HR 67 | Temp 98.0°F | Ht 65.0 in | Wt 93.5 lb

## 2014-10-03 DIAGNOSIS — N8320 Unspecified ovarian cysts: Secondary | ICD-10-CM | POA: Diagnosis not present

## 2014-10-03 DIAGNOSIS — N83209 Unspecified ovarian cyst, unspecified side: Secondary | ICD-10-CM

## 2014-10-03 NOTE — Progress Notes (Signed)
CLINIC ENCOUNTER NOTE  History:  19 y.o. G0P0000 here today for follow up after admission for ruptured hemorrhagic cyst and hemoperitoneum on 09/06/14 - 09/07/14. Was discharged home on OCPs, patient has not started yet but plans to start in the summer.  Denies any pain, bleeding or other GYN concerns. Currently uses condoms.   No past medical history on file.  Past Surgical History  Procedure Laterality Date  . Wisdom tooth extraction     The following portions of the patient's history were reviewed and updated as appropriate: allergies, current medications, past family history, past medical history, past social history, past surgical history and problem list.   Health Maintenance:  Never received Gardasil; plans to get it over the summer.  Review of Systems:  A comprehensive review of systems was negative.  Objective:  Physical Exam BP 99/72 mmHg  Pulse 67  Temp(Src) 98 F (36.7 C) (Oral)  Ht  (1.651 m)  Wt 93 lb 8 oz (42.411 kg)  BMI 15.56 kg/m2  LMP 09/09/2014 CONSTITUTIONAL: Well-developed, well-nourished female in no acute distress.  HENT:  Normocephalic, atraumatic, External right and left ear normal. Oropharynx is clear and moist EYES: Conjunctivae and EOM are normal. Pupils are equal, round, and reactive to light. No scleral icterus.  NECK: Normal range of motion, supple, no masses SKIN: Skin is warm and dry. No rash noted. Not diaphoretic. No erythema. No pallor. NEUROLGIC: Alert and oriented to person, place, and time. Normal reflexes, muscle tone coordination. No cranial nerve deficit noted. PSYCHIATRIC: Normal mood and affect. Normal behavior. Normal judgment and thought content. CARDIOVASCULAR: Normal heart rate noted, regular rhythm RESPIRATORY: Effort and breath sounds normal, no problems with respiration noted ABDOMEN: Soft, no distention noted.  No tenderness, rebound or guarding.  PELVIC: Deferred MUSCULOSKELETAL: Normal range of motion. No edema and no  tenderness.  Labs and Imaging US Transvaginal Non-ob  09/07/2014   CLINICAL DATA:  Acute onset of right lower quadrant abdominal pain and chronic amenorrhea. Leukocytosis. Initial encounter.  EXAM: TRANSABDOMINAL AND TRANSVAGINAL ULTRASOUND OF PELVIS  TECHNIQUE: Both transabdominal and transvaginal ultrasound examinations of the pelvis were performed. Transabdominal technique was performed for global imaging of the pelvis including uterus, ovaries, adnexal regions, and pelvic cul-de-sac. It was necessary to proceed with endovaginal exam following the transabdominal exam to visualize the uterus and ovaries in greater detail.  COMPARISON:  CT of the abdomen and pelvis performed earlier today at 12:56 a.m.  FINDINGS: Uterus  Measurements: 8.2 x 3.5 x 4.1 cm. No fibroids or other mass visualized.  Endometrium  Thickness: 0.9 cm.  No focal abnormality visualized.  Right ovary  Measurements: 4.4 x 3.0 x 3.8 cm. A 3.0 cm somewhat complex cyst is noted at the right ovary, with a mildly lace-like pattern, possibly reflecting a hemorrhagic cyst.  Left ovary  Measurements: 3.3 x 2.0 x 2.1 cm. Normal appearance/no adnexal mass.  Other findings  A large amount of complex fluid is noted within the pelvis, with several areas of clot seen.  IMPRESSION: Large amount of complex fluid again noted within the pelvis, concerning for blood, with several areas of clot noted. Somewhat complex 3.0 cm cyst at the right ovary may reflect a hemorrhagic cyst. This could reflect rupture of a hemorrhagic cyst; the amount of visualized blood is rather large for a hemorrhagic cyst.  These results were called by telephone at the time of interpretation on 09/07/2014 at 5:16 am to [facility] CNM, who verbally acknowledged these results.  Electronically Signed   By: Roanna RaiderJeffery  Chang M.D.   On: 09/07/2014 05:17   [place] Pelvis Complete  09/07/2014   CLINICAL DATA:  Acute onset of right lower quadrant abdominal pain and chronic amenorrhea. Leukocytosis.  Initial encounter.  EXAM: TRANSABDOMINAL AND TRANSVAGINAL ULTRASOUND OF PELVIS  TECHNIQUE: Both transabdominal and transvaginal ultrasound examinations of the pelvis were performed. Transabdominal technique was performed for global imaging of the pelvis including uterus, ovaries, adnexal regions, and pelvic cul-de-sac. It was necessary to proceed with endovaginal exam following the transabdominal exam to visualize the uterus and ovaries in greater detail.  COMPARISON:  CT of the abdomen and pelvis performed earlier today at 12:56 a.m.  FINDINGS: Uterus  Measurements: 8.2 x 3.5 x 4.1 cm. No fibroids or other mass visualized.  Endometrium  Thickness: 0.9 cm.  No focal abnormality visualized.  Right ovary  Measurements: 4.4 x 3.0 x 3.8 cm. A 3.0 cm somewhat complex cyst is noted at the right ovary, with a mildly lace-like pattern, possibly reflecting a hemorrhagic cyst.  Left ovary  Measurements: 3.3 x 2.0 x 2.1 cm. Normal appearance/no adnexal mass.  Other findings  A large amount of complex fluid is noted within the pelvis, with several areas of clot seen.  IMPRESSION: Large amount of complex fluid again noted within the pelvis, concerning for blood, with several areas of clot noted. Somewhat complex 3.0 cm cyst at the right ovary may reflect a hemorrhagic cyst. This could reflect rupture of a hemorrhagic cyst; the amount of visualized blood is rather large for a hemorrhagic cyst.  These results were called by telephone at the time of interpretation on 09/07/2014 at 5:16 am to [facility], who verbally acknowledged these results.   Electronically Signed   By: Roanna RaiderJeffery  Chang M.D.   On: 09/07/2014 05:17   Ct Abdomen Pelvis W Contrast  09/07/2014   CLINICAL DATA:  Right lower quadrant abdominal pain with nausea and vomiting. Leukocytosis.  EXAM: CT ABDOMEN AND PELVIS WITH CONTRAST  TECHNIQUE: Multidetector CT imaging of the abdomen and pelvis was performed using the standard protocol following bolus  administration of intravenous contrast.  CONTRAST:  50mL OMNIPAQUE IOHEXOL 300 MG/ML SOLN, 80mL OMNIPAQUE IOHEXOL 300 MG/ML SOLN  COMPARISON:  None.  FINDINGS: There is a large volume of fluid within the peritoneum. The fluid is of moderately high attenuation, suggesting blood. There is a peripherally enhancing right adnexal 3 cm lesion which could be the source of the hemorrhage although this is not conclusive.  The appendix is normal. There are normal appearances of the liver, spleen, pancreas, adrenals and kidneys.  IMPRESSION: Large volume peritoneal fluid with high attenuation suggesting it may be blood. Other complicated fluid collections such as a large volume of purulent material may be considered. There is a peripherally enhancing 3 cm right adnexal lesion which is not characterized on this scan but could be the source of the blood or fluid. These results were called by telephone at the time of interpretation on 09/07/2014 at 1:48 am to Dr. Eyvonne MechanicJEFFREY HEDGES , who verbally acknowledged these results.   Electronically Signed   By: Ellery Plunkaniel R Mitchell M.D.   On: 09/07/2014 01:48    Assessment & Plan:  Patient is stable after admission Told to call if concerning side effects develop after OCP start Routine preventative health maintenance measures emphasized.   Jaynie CollinsUGONNA  Hamsini Verrilli, MD, FACOG Attending Obstetrician & Gynecologist Center for [facility], [facility]

## 2014-11-19 ENCOUNTER — Emergency Department (HOSPITAL_COMMUNITY)
Admission: EM | Admit: 2014-11-19 | Discharge: 2014-11-19 | Disposition: A | Discharge status: Home / Self Care | Payer: BLUE CROSS/BLUE SHIELD | Attending: Emergency Medicine | Admitting: Emergency Medicine

## 2014-11-19 ENCOUNTER — Encounter (HOSPITAL_COMMUNITY): Payer: Self-pay | Admitting: Emergency Medicine

## 2014-11-19 DIAGNOSIS — E860 Dehydration: Secondary | ICD-10-CM

## 2014-11-19 DIAGNOSIS — Z720 Tobacco use: Secondary | ICD-10-CM | POA: Insufficient documentation

## 2014-11-19 DIAGNOSIS — R197 Diarrhea, unspecified: Secondary | ICD-10-CM

## 2014-11-19 DIAGNOSIS — Z3202 Encounter for pregnancy test, result negative: Secondary | ICD-10-CM | POA: Insufficient documentation

## 2014-11-19 DIAGNOSIS — R112 Nausea with vomiting, unspecified: Secondary | ICD-10-CM

## 2014-11-19 LAB — CBC WITH DIFFERENTIAL/PLATELET
BASOS ABS: 0.1 10*3/uL (ref 0.0–0.1)
BASOS PCT: 1 % (ref 0–1)
EOS PCT: 0 % (ref 0–5)
Eosinophils Absolute: 0 10*3/uL (ref 0.0–0.7)
HCT: 39.6 % (ref 36.0–46.0)
HEMOGLOBIN: 13.2 g/dL (ref 12.0–15.0)
LYMPHS PCT: 9 % — AB (ref 12–46)
Lymphs Abs: 1.1 10*3/uL (ref 0.7–4.0)
MCH: 31.1 pg (ref 26.0–34.0)
MCHC: 33.3 g/dL (ref 30.0–36.0)
MCV: 93.4 fL (ref 78.0–100.0)
MONO ABS: 0.6 10*3/uL (ref 0.1–1.0)
Monocytes Relative: 5 % (ref 3–12)
Neutro Abs: 9.9 10*3/uL — ABNORMAL HIGH (ref 1.7–7.7)
Neutrophils Relative %: 85 % — ABNORMAL HIGH (ref 43–77)
Platelets: 259 10*3/uL (ref 150–400)
RBC: 4.24 MIL/uL (ref 3.87–5.11)
RDW: 11.9 % (ref 11.5–15.5)
WBC: 11.7 10*3/uL — ABNORMAL HIGH (ref 4.0–10.5)

## 2014-11-19 LAB — COMPREHENSIVE METABOLIC PANEL
ALBUMIN: 5.2 g/dL — AB (ref 3.5–5.0)
ALT: 12 U/L — ABNORMAL LOW (ref 14–54)
ANION GAP: 11 (ref 5–15)
AST: 19 U/L (ref 15–41)
Alkaline Phosphatase: 36 U/L — ABNORMAL LOW (ref 38–126)
BUN: 6 mg/dL (ref 6–20)
CALCIUM: 9.8 mg/dL (ref 8.9–10.3)
CO2: 25 mmol/L (ref 22–32)
Chloride: 103 mmol/L (ref 101–111)
Creatinine, Ser: 0.49 mg/dL (ref 0.44–1.00)
GFR calc Af Amer: >60 mL/min (ref >60)
GFR calc non Af Amer: >60 mL/min (ref >60)
Glucose, Bld: 148 mg/dL — ABNORMAL HIGH (ref 65–99)
POTASSIUM: 3.6 mmol/L (ref 3.5–5.1)
SODIUM: 139 mmol/L (ref 135–145)
TOTAL PROTEIN: 8.2 g/dL — AB (ref 6.5–8.1)
Total Bilirubin: 0.7 mg/dL (ref 0.3–1.2)

## 2014-11-19 LAB — LIPASE, BLOOD: LIPASE: 12 U/L — AB (ref 22–51)

## 2014-11-19 LAB — I-STAT BETA HCG BLOOD, ED (MC, WL, AP ONLY): I-stat hCG, quantitative: <5 m[IU]/mL (ref <5)

## 2014-11-19 MED ORDER — METOCLOPRAMIDE HCL 5 MG/ML IJ SOLN: 10.0000 mg | Freq: Once | INTRAMUSCULAR | Status: DC

## 2014-11-19 MED ORDER — DICYCLOMINE HCL 20 MG PO TABS: 20.0000 mg | ORAL_TABLET | Freq: Three times a day (TID) | ORAL | Status: DC | PRN

## 2014-11-19 MED ORDER — SODIUM CHLORIDE 0.9 % IV SOLN
1000.0000 mL | Freq: Once | INTRAVENOUS | Status: AC
  Administered 2014-11-19: 1000 mL via INTRAVENOUS

## 2014-11-19 MED ORDER — SODIUM CHLORIDE 0.9 % IV SOLN
1000.0000 mL | INTRAVENOUS | Status: DC
  Administered 2014-11-19: 1000 mL via INTRAVENOUS

## 2014-11-19 MED ORDER — ONDANSETRON 8 MG PO TBDP: 8.0000 mg | ORAL_TABLET | Freq: Three times a day (TID) | ORAL | Status: DC | PRN

## 2014-11-19 MED ORDER — MORPHINE SULFATE 4 MG/ML IJ SOLN
4.0000 mg | Freq: Once | INTRAMUSCULAR | Status: AC
  Administered 2014-11-19: 4 mg via INTRAVENOUS
  Filled 2014-11-19: qty 1

## 2014-11-19 MED ORDER — ONDANSETRON HCL 4 MG/2ML IJ SOLN
4.0000 mg | Freq: Once | INTRAMUSCULAR | Status: AC
  Administered 2014-11-19: 4 mg via INTRAVENOUS
  Filled 2014-11-19: qty 2

## 2014-11-19 NOTE — ED Notes (Signed)
Pt c/o vomiting and diarrhea that started yesterday, last diarrhea was this morning and last vomit was just before triage. denies abd pain.

## 2014-11-19 NOTE — ED Provider Notes (Signed)
CSN: [id number]     Arrival date & time 11/19/14  4627 History   First MD Initiated Contact with Patient 11/19/14 [phone]     Chief Complaint  Patient presents with  . Emesis  . Diarrhea      The history is provided by the patient and a friend.   patient ports nausea vomiting and diarrhea since yesterday.  No blood noted in her vomit or her diarrhea.  No recent sick contacts.  No fevers or chills.  She reports some upper abdominal cramping.  She does have a history of ovarian cyst but reports this feels different.  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  She does not have a history of laxative abuse as far she knows.  No fevers or chills.  No dysuria or urinary frequency.  She states she feels lightheaded when she stands.  History reviewed. No pertinent past medical history. Past Surgical History  Procedure Laterality Date  . Wisdom tooth extraction     History reviewed. No pertinent family history. History  Substance Use Topics  . Smoking status: Current Every Day Smoker -- 1.00 packs/day  . Smokeless tobacco: Not on file  . Alcohol Use: Yes     Comment: socially    OB History    Gravida Para Term Preterm AB TAB SAB Ectopic Multiple Living   0 0 0 0 0 0 0 0 0 0      Review of Systems  All other systems reviewed and are negative.     Allergies  Codeine  Home Medications   Prior to Admission medications   Not on File   LMP 10/14/2014 (Exact Date) Physical Exam  Constitutional: She is oriented to person, place, and time. She appears well-developed and well-nourished. No distress.  HENT:  Head: Normocephalic and atraumatic.  Eyes: EOM are normal.  Neck: Normal range of motion.  Cardiovascular: Normal rate, regular rhythm and normal heart sounds.   Pulmonary/Chest: Effort normal and breath sounds normal.  Abdominal: Soft. She exhibits no distension. There is  no tenderness.  Musculoskeletal: Normal range of motion.  Neurological: She is alert and oriented to person, place, and time.  Skin: Skin is warm and dry.  Psychiatric: She has a normal mood and affect. Judgment normal.  Nursing note and vitals reviewed.   ED Course  Procedures (including critical care time) Labs Review Labs Reviewed  CBC WITH DIFFERENTIAL/PLATELET - Abnormal; Notable for the following:    WBC 11.7 (*)    Neutrophils Relative % 85 (*)    Neutro Abs 9.9 (*)    Lymphocytes Relative 9 (*)    All other components within normal limits  COMPREHENSIVE METABOLIC PANEL - Abnormal; Notable for the following:    Glucose, Bld 148 (*)    Total Protein 8.2 (*)    Albumin 5.2 (*)    ALT 12 (*)    Alkaline Phosphatase 36 (*)    All other components within normal limits  LIPASE, BLOOD - Abnormal; Notable for the following:    Lipase 12 (*)    All other components within normal limits  I-STAT BETA HCG BLOOD, ED ([facility], [facility], [facility] ONLY)    Imaging Review No results found.   EKG Interpretation None      MDM   Final diagnoses:  None    Patient feeling better at this time.  Hydrated in  the emergency department.  Labs without significant abnormality.  Discharge home in good condition.  Primary care follow-up.  Patient understands to return the emergency department for new or worsening symptoms.    Azalia Bilis, MD 11/19/14 [phone]

## 2014-11-19 NOTE — Discharge Instructions (Signed)
Dehydration, Adult Dehydration is when you lose more fluids from the body than you take in. Vital organs like the kidneys, brain, and heart cannot function without a proper amount of fluids and salt. Any loss of fluids from the body can cause dehydration.  CAUSES   Vomiting.  Diarrhea.  Excessive sweating.  Excessive urine output.  Fever. SYMPTOMS  Mild dehydration  Thirst.  Dry lips.  Slightly dry mouth. Moderate dehydration  Very dry mouth.  Sunken eyes.  Skin does not bounce back quickly when lightly pinched and released.  Dark urine and decreased urine production.  Decreased tear production.  Headache. Severe dehydration  Very dry mouth.  Extreme thirst.  Rapid, weak pulse (more than 100 beats per minute at rest).  Cold hands and feet.  Not able to sweat in spite of heat and temperature.  Rapid breathing.  Blue lips.  Confusion and lethargy.  Difficulty being awakened.  Minimal urine production.  No tears. DIAGNOSIS  Your caregiver will diagnose dehydration based on your symptoms and your exam. Blood and urine tests will help confirm the diagnosis. The diagnostic evaluation should also identify the cause of dehydration. TREATMENT  Treatment of mild or moderate dehydration can often be done at home by increasing the amount of fluids that you drink. It is best to drink small amounts of fluid more often. Drinking too much at one time can make vomiting worse. Refer to the home care instructions below. Severe dehydration needs to be treated at the hospital where you will probably be given intravenous (IV) fluids that contain water and electrolytes. HOME CARE INSTRUCTIONS   Ask your caregiver about specific rehydration instructions.  Drink enough fluids to keep your urine clear or pale yellow.  Drink small amounts frequently if you have nausea and vomiting.  Eat as you normally do.  Avoid:  Foods or drinks high in sugar.  Carbonated  drinks.  Juice.  Extremely hot or cold fluids.  Drinks with caffeine.  Fatty, greasy foods.  Alcohol.  Tobacco.  Overeating.  Gelatin desserts.  Wash your hands well to avoid spreading bacteria and viruses.  Only take over-the-counter or prescription medicines for pain, discomfort, or fever as directed by your caregiver.  Ask your caregiver if you should continue all prescribed and over-the-counter medicines.  Keep all follow-up appointments with your caregiver. SEEK MEDICAL CARE IF:  You have abdominal pain and it increases or stays in one area (localizes).  You have a rash, stiff neck, or severe headache.  You are irritable, sleepy, or difficult to awaken.  You are weak, dizzy, or extremely thirsty. SEEK IMMEDIATE MEDICAL CARE IF:   You are unable to keep fluids down or you get worse despite treatment.  You have frequent episodes of vomiting or diarrhea.  You have blood or green matter (bile) in your vomit.  You have blood in your stool or your stool looks black and tarry.  You have not urinated in 6 to 8 hours, or you have only urinated a small amount of very dark urine.  You have a fever.  You faint. MAKE SURE YOU:   Understand these instructions.  Will watch your condition.  Will get help right away if you are not doing well or get worse. Document Released: 05/24/2005 Document Revised: 08/16/2011 Document Reviewed: 01/11/2011 ExitCare Patient Information 2015 [employer]. This information is not intended to replace advice given to you by your health care provider. Make sure you discuss any questions you have with your health care   provider.  

## 2015-02-27 ENCOUNTER — Other Ambulatory Visit: Payer: Self-pay | Admitting: Physician Assistant

## 2015-02-27 ENCOUNTER — Encounter: Payer: Self-pay | Admitting: Physician Assistant

## 2015-02-27 ENCOUNTER — Ambulatory Visit (INDEPENDENT_AMBULATORY_CARE_PROVIDER_SITE_OTHER): Payer: BLUE CROSS/BLUE SHIELD | Admitting: Physician Assistant

## 2015-02-27 VITALS — BP 105/71 | HR 85 | Temp 97.6°F | Resp 16 | Ht 64.0 in | Wt 96.0 lb

## 2015-02-27 DIAGNOSIS — G4700 Insomnia, unspecified: Secondary | ICD-10-CM

## 2015-02-27 DIAGNOSIS — F5000 Anorexia nervosa, unspecified: Secondary | ICD-10-CM | POA: Diagnosis not present

## 2015-02-27 DIAGNOSIS — N912 Amenorrhea, unspecified: Secondary | ICD-10-CM

## 2015-02-27 DIAGNOSIS — F419 Anxiety disorder, unspecified: Secondary | ICD-10-CM

## 2015-02-27 LAB — CBC
HCT: 41.3 % (ref 36.0–46.0)
Hemoglobin: 14.2 g/dL (ref 12.0–15.0)
MCH: 32.1 pg (ref 26.0–34.0)
MCHC: 34.4 g/dL (ref 30.0–36.0)
MCV: 93.4 fL (ref 78.0–100.0)
MPV: 9.8 fL (ref 8.6–12.4)
PLATELETS: 288 10*3/uL (ref 150–400)
RBC: 4.42 MIL/uL (ref 3.87–5.11)
RDW: 13 % (ref 11.5–15.5)
WBC: 9.4 10*3/uL (ref 4.0–10.5)

## 2015-02-27 LAB — COMPLETE METABOLIC PANEL WITH GFR
ALBUMIN: 4.9 g/dL (ref 3.6–5.1)
ALK PHOS: 50 U/L (ref 47–176)
ALT: 10 U/L (ref 5–32)
AST: 17 U/L (ref 12–32)
BILIRUBIN TOTAL: 0.6 mg/dL (ref 0.2–1.1)
BUN: 8 mg/dL (ref 7–20)
CO2: 28 mmol/L (ref 20–31)
Calcium: 9.6 mg/dL (ref 8.9–10.4)
Chloride: 103 mmol/L (ref 98–110)
Creat: 0.58 mg/dL (ref 0.50–1.00)
GFR, Est African American: >89 mL/min (ref >=60)
GLUCOSE: 90 mg/dL (ref 65–99)
Potassium: 3.9 mmol/L (ref 3.8–5.1)
SODIUM: 140 mmol/L (ref 135–146)
TOTAL PROTEIN: 7.8 g/dL (ref 6.3–8.2)

## 2015-02-27 LAB — POCT URINE PREGNANCY: Preg Test, Ur: NEGATIVE

## 2015-02-27 MED ORDER — ESCITALOPRAM OXALATE 10 MG PO TABS: 10.0000 mg | ORAL_TABLET | Freq: Every day | ORAL | Status: DC

## 2015-02-27 MED ORDER — CLONAZEPAM 0.5 MG PO TABS: 0.2500 mg | ORAL_TABLET | Freq: Two times a day (BID) | ORAL | Status: DC | PRN

## 2015-02-27 NOTE — Patient Instructions (Signed)
Start Lexapro - this is to help with the chemical imbalance in your brain allowing these thoughts and anxiety to happen  Use Klonopin as needed - before pm meal and then in the am for anxiety  Continue seeing Jeanella Flattery  Make appt with nutritionist  Recheck with me in 2 weeks
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
## 2015-02-27 NOTE — Progress Notes (Signed)
   Lindsay Freeman  MRN: [id number] DOB: 03/27/1996  Subjective:  Pt presents to clinic  Patient Active Problem List   Diagnosis Date Noted  . Hemorrhagic ovarian cyst 09/07/2014    Current Outpatient Prescriptions on File Prior to Visit  Medication Sig Dispense Refill  . dicyclomine (BENTYL) 20 MG tablet Take 1 tablet (20 mg total) by mouth every 8 (eight) hours as needed (abdominal pain). (Patient not taking: Reported on 02/27/2015) 20 tablet 0  . ondansetron (ZOFRAN ODT) 8 MG disintegrating tablet Take 1 tablet (8 mg total) by mouth every 8 (eight) hours as needed for nausea or vomiting. (Patient not taking: Reported on 02/27/2015) 12 tablet 0   No current facility-administered medications on file prior to visit.    Allergies  Allergen Reactions  . Codeine Nausea And Vomiting    Review of Systems Objective:  BP 105/71 mmHg  Pulse 85  Temp(Src) 97.6 F (36.4 C) (Oral)  Resp 16  Ht  (1.626 m)  Wt 96 lb (43.545 kg)  BMI 16.47 kg/m2  SpO2 98%  Physical Exam  Assessment and Plan :  No diagnosis found.  Benny Lennert PA-C  [facility] 02/27/2015 4:20 PM

## 2015-02-28 ENCOUNTER — Encounter: Payer: Self-pay | Admitting: Physician Assistant

## 2015-02-28 DIAGNOSIS — F5000 Anorexia nervosa, unspecified: Secondary | ICD-10-CM | POA: Insufficient documentation

## 2015-02-28 DIAGNOSIS — G4700 Insomnia, unspecified: Secondary | ICD-10-CM | POA: Insufficient documentation

## 2015-02-28 DIAGNOSIS — F419 Anxiety disorder, unspecified: Secondary | ICD-10-CM | POA: Insufficient documentation

## 2015-02-28 LAB — TSH: TSH: 0.6 u[IU]/mL (ref 0.350–4.500)

## 2015-03-02 LAB — MAGNESIUM: Magnesium: 2.2 mg/dL (ref 1.5–2.5)

## 2015-03-02 LAB — PHOSPHORUS: Phosphorus: 3.4 mg/dL (ref 2.5–4.5)

## 2015-03-04 ENCOUNTER — Telehealth: Payer: Self-pay

## 2015-03-04 NOTE — Telephone Encounter (Signed)
Pt needs to talk with Benny Lennert about the dosage and side effects of the current meds that she has prescribed to her recently   Best number [phone]

## 2015-03-04 NOTE — Telephone Encounter (Signed)
If she is willing I would have her continue the lexapro - this is a SE that should go away in about 2 week time period esp if there is associated nausea.  I am ok with her taking Klonopin 2-3 at a time - how long is it lasting?  Does it make the panic go away?

## 2015-03-04 NOTE — Telephone Encounter (Signed)
PATIENT WANTS SARAH TO KNOW THAT THE PANIC DOES NOT GO AWAY. SHE THINKS SHE NEEDS TO HAVE HER DOSAGE INCREASED. BEST PHONE [phone] (CELL)  PHARMACY CHOICE IS [facility] ON [address].  MBC

## 2015-03-04 NOTE — Telephone Encounter (Signed)
Spoke with pt, she states the escitalopram (LEXAPRO) 10 MG tablet [[id number] is making her  have no appetite. Also her Klonopin is not working to relax her, she ends up taking 2-3 to settle panic attack. She states we can leave a detailed message on her VM. You wanted to see her in 2 weeks does she need to come back sooner? Please advise.

## 2015-03-05 ENCOUNTER — Ambulatory Visit (INDEPENDENT_AMBULATORY_CARE_PROVIDER_SITE_OTHER): Payer: BLUE CROSS/BLUE SHIELD | Admitting: Internal Medicine

## 2015-03-05 ENCOUNTER — Encounter: Payer: Self-pay | Admitting: Family Medicine

## 2015-03-05 ENCOUNTER — Encounter: Payer: Self-pay | Admitting: Internal Medicine

## 2015-03-05 VITALS — BP 96/64 | HR 80 | Temp 98.8°F | Resp 16 | Ht 64.0 in | Wt 94.2 lb

## 2015-03-05 DIAGNOSIS — F4522 Body dysmorphic disorder: Secondary | ICD-10-CM | POA: Insufficient documentation

## 2015-03-05 DIAGNOSIS — G4700 Insomnia, unspecified: Secondary | ICD-10-CM

## 2015-03-05 DIAGNOSIS — F419 Anxiety disorder, unspecified: Secondary | ICD-10-CM

## 2015-03-05 MED ORDER — CLONAZEPAM 1 MG PO TABS: 0.5000 mg | ORAL_TABLET | Freq: Every day | ORAL | Status: DC

## 2015-03-05 MED ORDER — ALPRAZOLAM 1 MG PO TABS: ORAL_TABLET | ORAL | Status: DC

## 2015-03-05 NOTE — Telephone Encounter (Signed)
Pt called checking on status of this call. She would like a CB as soon as possible. [phone]

## 2015-03-05 NOTE — Telephone Encounter (Signed)
Spoke with pt, it does not last very long and it does not take the panic away. She is completely out of her medication and feels it is imperative to get an Rx today. I advised pt Lindsay Freeman was not here and that I would try my best. Please advise. She agrees to keep taking lexapro as well.

## 2015-03-05 NOTE — Progress Notes (Signed)
   Subjective:    Patient ID: Lindsay Freeman, female    DOB: 1996/04/08, 19 y.o.   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 but has never led to medical complications or hospitalization or treatment//there was an episode of dehydration last summer requiring treatment that was related to poor intake during heat stress She is in a steady relationship living with her boyfriend for the last 18 months Sophomore [employer] history major with interest in artistic expression and performance art like that associated with a sociological movement:The grateful dead  In high school she was often unable to attend school due to anxiety She still has public anxiety but has developed a way to "disappear into the crowd" Since starting Lexapro she has volunteered to lead a study group In the past she has had significant depression related to her inability to function in normal way with her peers She feels both parents are supportive at this point but prefers her father for her most significant problems   Review of Systems Her activity is sometimes decreased due to thoracic back pain and shoulder pain Present many years Never evaluated or treated Requires medication at times No radicular symptoms, weakness    Objective:   Physical Exam BP 96/64 mmHg  Pulse 80  Temp(Src) 98.8 F (37.1 C) (Oral)  Resp 16  Ht  (1.626 m)  Wt 94 lb 3.2 oz (42.729 kg)  BMI 16.16 kg/m2 No acute distress Her  mood is good and affect appropriate Her thought content appears normal and her comments are very appropriate She is eager to continue the improvement she has discovered discovered  Back exam reveals a mild scoliosis thoracic to the right with tension and mild tenderness in the muscles including parathoracic and trapezii       Assessment & Plan:  Anxiety - she has responded partially to medication before meals in order to allow her to eat rather than avoid eating secondary to anxiety related to her body dysmorphia//it seems prudent to use a rapid acting benzodiazepine before meals focusing on 2 meals a day for now  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

## 2015-03-05 NOTE — Telephone Encounter (Signed)
Spoke with pt, advised her message from [place]. Pt is coming to see Dr. Merla Riches today at 2:45pm

## 2015-03-05 NOTE — Telephone Encounter (Signed)
Patient needs to come back to clinic.  She has gone through her Klonopin prescription in 5 days.  She would be a good candidate for Mirtazipine given its quick onset of action. Dr. Clelia Croft is familiar with her case per Sarah's note.  Deliah Boston, MS, PA-C   1:12 PM, 03/05/2015

## 2015-03-12 ENCOUNTER — Ambulatory Visit: Payer: BLUE CROSS/BLUE SHIELD | Admitting: Family Medicine

## 2015-03-13 ENCOUNTER — Ambulatory Visit (INDEPENDENT_AMBULATORY_CARE_PROVIDER_SITE_OTHER): Payer: BLUE CROSS/BLUE SHIELD | Admitting: Physician Assistant

## 2015-03-13 ENCOUNTER — Encounter: Payer: Self-pay | Admitting: Physician Assistant

## 2015-03-13 VITALS — BP 106/54 | HR 104 | Temp 98.4°F | Resp 16 | Ht 63.5 in

## 2015-03-13 DIAGNOSIS — F5000 Anorexia nervosa, unspecified: Secondary | ICD-10-CM

## 2015-03-13 DIAGNOSIS — F419 Anxiety disorder, unspecified: Secondary | ICD-10-CM

## 2015-03-13 DIAGNOSIS — G4700 Insomnia, unspecified: Secondary | ICD-10-CM | POA: Diagnosis not present

## 2015-03-13 MED ORDER — ALPRAZOLAM 1 MG PO TABS: ORAL_TABLET | ORAL | Status: DC

## 2015-03-13 MED ORDER — CLONAZEPAM 1 MG PO TABS: ORAL_TABLET | ORAL | Status: DC

## 2015-03-13 NOTE — Patient Instructions (Addendum)
Goals to do before the next visit with me in 2 weeks - continue therapy with Verlan Friends - make and appointment or have an appointment with nutritionist - continue medications  - use Klonopin 1-1.5 mg Klonopin in the am (every am)  - continue Xanax before meals (try for 2 meals a day)  - continue Lexapro

## 2015-03-13 NOTE — Progress Notes (Signed)
Lindsay Freeman  MRN: [id number] DOB: June 28, 1995  Subjective:  Pt presents to clinic for recheck.  Since her last visit she feels like she is doing better.  She is tolerating the Lexapro and she feels like her panic attacks are less severe and happening less often.  She has been using klonopin for sleeping - 1 mg for sleeping and it seems to help some nights - she was having to much drowsiness during the day when she took it and and she was not having relief.  She was unable to get the Rx because the pharmacy stated that it was to early.  Since starting Xanax 9/28 she has used it for panic and for eating. Panic attacks - takes up to 3 - thinks it is related to midterms - she thinks that it is lower than before would have been - needs 3 to help with the symptoms - but she staggers the dose - she does not take them together 1-2 meals a day - needs 1-2 Xanax to help but most days she is only eating 1 meal a day.  Set up the nutritionist appt - has not yet done Therapy - one appt with Verlan Friends since I saw her -   Concerned about a sinus infection.  Coughing - phlegm production all day - has h/o allergies but she takes nothing for them.  She has mild sinus congestion and a cough that seems to be coming from her throat.  It has been going on for weeks and not getting worse and she has something like this every year.  Patient Active Problem List   Diagnosis Date Noted  . 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 Anorexia nervosa 02/28/2015  . Anxiety 02/28/2015  . Insomnia 02/28/2015  . Hemorrhagic ovarian cyst 09/07/2014    Current Outpatient Prescriptions on File Prior to Visit  Medication Sig Dispense Refill  . escitalopram (LEXAPRO) 10 MG tablet Take 1 tablet (10 mg total) by mouth daily. 30 tablet 0   No current facility-administered medications on file prior to visit.    Allergies  Allergen Reactions  . Codeine Nausea And Vomiting    Review of Systems  HENT: Positive for  congestion, postnasal drip, rhinorrhea (clear) and sinus pressure. Negative for sore throat.   Respiratory: Positive for cough.   Allergic/Immunologic: Positive for environmental allergies.   Objective:  BP 106/54 mmHg  Pulse 104  Temp(Src) 98.4 F (36.9 C) (Oral)  Resp 16  Ht 5' 3.5" (1.613 m)  SpO2 97%  Physical Exam  Constitutional: She is oriented to person, place, and time and well-developed, well-nourished, and in no distress.  93 lb 12.8 oz (42.547 kg)   HENT:  Head: Normocephalic and atraumatic.  Right Ear: Hearing, tympanic membrane, external ear and ear canal normal.  Left Ear: Hearing, tympanic membrane, external ear and ear canal normal.  Nose: Mucosal edema (pale right turbinates) present.  Mouth/Throat: Uvula is midline, oropharynx is clear and moist and mucous membranes are normal.  Eyes: Conjunctivae are normal.  Neck: Normal range of motion.  Cardiovascular: Regular rhythm and normal heart sounds.  Tachycardia present.   No murmur heard. Pulmonary/Chest: Effort normal and breath sounds normal.  Neurological: She is alert and oriented to person, place, and time. Gait normal.  Skin: Skin is warm and dry. There is pallor.  Psychiatric: Mood, memory, affect and judgment normal.  Vitals reviewed.  Spent >30 mins with patient with more than 50% spent in counseling. Assessment and Plan :  Anxiety - Plan: ALPRAZolam (XANAX) 1 MG tablet  Insomnia - Plan: clonazePAM (KLONOPIN) 1 MG tablet  Anorexia nervosa - Plan: ALPRAZolam (XANAX) 1 MG tablet   We talked at length about the positive effect that lexapro is already having and that is wonderful.  We talked about the importance of therapy both mental and nutritional.  Her goal by the next time she sees me in 2 weeks is to have either seen the nutritionist or to have an appt schedule.  We talked about continuing the Klonopin at night because sleep is really important in healing but we also added it during the am to help  with her general anxiety in hopes to continue to decrease her panic attacks.  I am happy that the Xanax is helping her eating but she is really only eating about once a day and I think that nutrition will help with that planning but she overall is using to much Xanax due to the need for panic attacks and for eating.  We are going to focus on the xanax just for eating for the next 2 weeks and try to separate out the unassociated panic attacks/.  We called the pharmacy and hgave an OK to fill Rx early but we talked to the pt about the importance of using her medications as Rx.  I suspect at her next appt we will increase her Lexapro to .  Benny Lennert PA-C  [facility] 03/13/2015 3:17 PM

## 2015-03-14 ENCOUNTER — Encounter: Payer: Self-pay | Admitting: Physician Assistant

## 2015-03-26 ENCOUNTER — Other Ambulatory Visit: Payer: Self-pay | Admitting: Physician Assistant

## 2015-03-27 ENCOUNTER — Ambulatory Visit: Payer: BLUE CROSS/BLUE SHIELD | Admitting: Physician Assistant

## 2015-04-24 ENCOUNTER — Other Ambulatory Visit: Payer: Self-pay | Admitting: Physician Assistant

## 2015-05-20 ENCOUNTER — Encounter: Payer: Self-pay | Admitting: Physician Assistant

## 2015-05-20 ENCOUNTER — Ambulatory Visit (INDEPENDENT_AMBULATORY_CARE_PROVIDER_SITE_OTHER): Payer: BLUE CROSS/BLUE SHIELD | Admitting: Physician Assistant

## 2015-05-20 VITALS — BP 107/68 | HR 75 | Temp 98.1°F | Resp 16 | Ht 64.0 in | Wt 95.0 lb

## 2015-05-20 DIAGNOSIS — F419 Anxiety disorder, unspecified: Secondary | ICD-10-CM | POA: Diagnosis not present

## 2015-05-20 DIAGNOSIS — G4700 Insomnia, unspecified: Secondary | ICD-10-CM

## 2015-05-20 DIAGNOSIS — F5000 Anorexia nervosa, unspecified: Secondary | ICD-10-CM

## 2015-05-20 MED ORDER — CLONAZEPAM 1 MG PO TABS: 1.0000 mg | ORAL_TABLET | Freq: Every day | ORAL | Status: DC

## 2015-05-20 MED ORDER — ESCITALOPRAM OXALATE 20 MG PO TABS: 20.0000 mg | ORAL_TABLET | Freq: Every day | ORAL | Status: DC

## 2015-05-20 MED ORDER — TRAZODONE HCL 50 MG PO TABS: 25.0000 mg | ORAL_TABLET | Freq: Every evening | ORAL | Status: DC | PRN

## 2015-05-20 MED ORDER — ALPRAZOLAM 1 MG PO TABS: ORAL_TABLET | ORAL | Status: DC

## 2015-05-20 NOTE — Progress Notes (Signed)
Lindsay Freeman  MRN: [id number] DOB: [birth year]  Subjective:  Pt presents to clinic for a recheck of her chronic medical problems. Her anxiety has been really high recently with school exams and the holidays are always really hard on her - some related to eating in with large amounts of people and her grandfather passed away around Thanksgiving 3 years ago and the anniversary still impacts her negatively - she feels like it gets better after Thanksgiving but does not resolve until the 1st of the year.  She has tried to improve her meals and states that she is better with her eating.  Meals per day - at least 1-2 food intake a day - she considers any food intake a meal even if it is a few bites of a danish Fluids intake -  16 oz during the day then 8-16 oz at night of water  Her panic attacks seem worse -they are daily and intensity is worse - she has been out of the Xanax and Klonopin for a while. She really feels like the Lexapro is helping - she feels like her response to the anxiety is improved since being on that medication   Lindsay Freeman - has been seeing her and plans to see someone at home during break that she has seen before Nutritional - not seeing anyone for that - trying to pay more attention - has not follow-through with the plan that she needs this also  She is not on any birth control because she is afraid of weight gain and of taking a medication like this every day.  She still finds a lot of support in her boyfriend.  Patient Active Problem List   Diagnosis Date Noted  . 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 Anorexia nervosa 02/28/2015  . Anxiety 02/28/2015  . Insomnia 02/28/2015  . Hemorrhagic ovarian cyst 09/07/2014    No current outpatient prescriptions on file prior to visit.   No current facility-administered medications on file prior to visit.    Allergies  Allergen Reactions  . Codeine Nausea And Vomiting    Review of Systems  Psychiatric/Behavioral:  Positive for sleep disturbance. The patient is nervous/anxious.    Objective:  BP 107/68 mmHg  Pulse 75  Temp(Src) 98.1 F (36.7 C)  Resp 16  Ht 5\' 4"  (1.626 m)  Wt 95 lb (43.092 kg)  BMI 16.30 kg/m2  Physical Exam  Constitutional: She is oriented to person, place, and time and well-developed, well-nourished, and in no distress.  HENT:  Head: Normocephalic and atraumatic.  Right Ear: Hearing and external ear normal.  Left Ear: Hearing and external ear normal.  Eyes: Conjunctivae are normal.  Neck: Normal range of motion.  Cardiovascular: Normal rate, regular rhythm and normal heart sounds.   No murmur heard. Pulmonary/Chest: Effort normal and breath sounds normal. She has no wheezes.  Neurological: She is alert and oriented to person, place, and time. Gait normal.  Skin: Skin is warm and dry.  Psychiatric: Mood, memory, affect and judgment normal.  Vitals reviewed.  Spent 30 mins with patient with >50% spent counseling the patient Assessment and Plan :  Insomnia - Plan: clonazePAM (KLONOPIN) 1 MG tablet, traZODone (DESYREL) 50 MG tablet  Anxiety - Plan: escitalopram (LEXAPRO) 20 MG tablet, ALPRAZolam (XANAX) 1 MG tablet  Anorexia nervosa - Plan: ALPRAZolam (XANAX) 1 MG tablet   She does not seem to be as good to me as she seems to think that she is - her eating does not  seem to have improved but she does state that she has been out of her Xanax and that does seem to help her eat better.  Since her anxiety has been worse I suspect that is the cause of her decreased eating - her weight is stable at this visit in fact it is up 2 #s.  I really encouraged her to start seeing a nutritional therapist because I think that this can be helpful but she is hesitant.  We will increase her Lexapro today because she is still having a lot of anxiety and I hope this will decrease her panic attacks.  Her sleep is still a problem and we will add Trazodone to see if she will less Klonopin and her  improved sleep will hopefully reduce her daytime anxiety.  She will f/u with her therapist at home.  She will continue to try and eat at least 2x/day but focus more on proteins vs carbs.  Recheck in 6 weeks  Benny Lennert PA-C  [facility] 05/20/2015 10:56 AM

## 2015-05-20 NOTE — Patient Instructions (Signed)
Increase your Lexapro to 20mg  a day  To start the Trazodone - start with 1/2 pill at night for the 1st 4 nights - increase the dose by 25mg  (1/2 pill) every 4-5 nights until either you sleep well, have side effects or reach a nightly dose of 150mg 

## 2015-07-10 ENCOUNTER — Ambulatory Visit: Payer: BLUE CROSS/BLUE SHIELD | Admitting: Physician Assistant

## 2015-07-29 ENCOUNTER — Ambulatory Visit (INDEPENDENT_AMBULATORY_CARE_PROVIDER_SITE_OTHER): Payer: BLUE CROSS/BLUE SHIELD | Admitting: Physician Assistant

## 2015-07-29 ENCOUNTER — Encounter: Payer: Self-pay | Admitting: Physician Assistant

## 2015-07-29 VITALS — BP 100/60 | HR 97 | Temp 98.4°F | Resp 16 | Wt 103.4 lb

## 2015-07-29 DIAGNOSIS — F419 Anxiety disorder, unspecified: Secondary | ICD-10-CM | POA: Diagnosis not present

## 2015-07-29 DIAGNOSIS — F5000 Anorexia nervosa, unspecified: Secondary | ICD-10-CM | POA: Diagnosis not present

## 2015-07-29 DIAGNOSIS — G4700 Insomnia, unspecified: Secondary | ICD-10-CM | POA: Diagnosis not present

## 2015-07-29 DIAGNOSIS — M6248 Contracture of muscle, other site: Secondary | ICD-10-CM | POA: Diagnosis not present

## 2015-07-29 DIAGNOSIS — M62838 Other muscle spasm: Secondary | ICD-10-CM

## 2015-07-29 MED ORDER — CLONAZEPAM 1 MG PO TABS: 1.0000 mg | ORAL_TABLET | Freq: Every day | ORAL | Status: DC

## 2015-07-29 MED ORDER — TRAZODONE HCL 50 MG PO TABS: 25.0000 mg | ORAL_TABLET | Freq: Every evening | ORAL | Status: DC | PRN

## 2015-07-29 MED ORDER — ALPRAZOLAM 1 MG PO TABS: ORAL_TABLET | ORAL | Status: DC

## 2015-07-29 MED ORDER — ESCITALOPRAM OXALATE 20 MG PO TABS: 20.0000 mg | ORAL_TABLET | Freq: Every day | ORAL | Status: DC

## 2015-07-29 MED ORDER — CYCLOBENZAPRINE HCL 5 MG PO TABS: 5.0000 mg | ORAL_TABLET | Freq: Three times a day (TID) | ORAL | Status: DC | PRN

## 2015-07-29 NOTE — Patient Instructions (Addendum)
Klonopin in the mornings. Xanax as needed. Continue lexapro Continue trazodone  Take ibuprofen during the day for your neck. Take flexeril at night instead of trazodone. May start back on trazodone when out of flexeril or when no longer needing flexeril. Apply heat.  Make follow up with Verlan Friends Make appt with nutritionist. Follow up with Benny Lennert in 2 months.

## 2015-07-29 NOTE — Progress Notes (Signed)
[facility] [address], [place] [address] [phone]  Date:  07/29/2015   Name:  Lindsay Freeman   DOB:  01-Aug-1995   MRN:  [id number]  PCP:  No PCP Per Patient    Chief Complaint: Medication Refill   History of Present Illness:  This is a 20 y.o. female with PMH anorexia nervosa, body dysmorphic disorder, anxiety, insomnia who is presenting for follow up anorexia nervosa, anxiety and insomnia.  Last seen by Benny Lennert, PA, on 05/20/15. At that time weight was stable but anxiety increased. Prozac was increased to 20 mg QD. Xanax refilled. Started on trazodone for sleep. Pt states she thinks she is doing well. The medications have helped a lot. She has gained 8 pounds. She takes xanax 3-4 tabs a week. Takes if she has a panic attack or if she is going to have to eat in public. Takes clonopin each morning. Trazodone helping with sleep but she ran out.  Meals per day - 2 meals. Would not specify what exactly she eats. Fluid intake - coffee in the morning, iced tea, 16 oz water a day. Knows she should be drinking more water.  Was seeing Verlan Friends but has not seen in a few months. Saw a therapist at home over winter break. Since being back she has not seen Verlan Friends -- she has not decided whether she wants to return. Planning to schedule appt with nutritionist soon.  She is complaining of right sided neck pain x 2 weeks. Thinks she slept on it wrong. Boyfriend has been massaging but not helping.  Review of Systems:  Review of Systems See HPI  Patient Active Problem List   Diagnosis Date Noted  . 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 Anorexia nervosa 02/28/2015  . Anxiety 02/28/2015  . Insomnia 02/28/2015  . Hemorrhagic ovarian cyst 09/07/2014    Prior to Admission medications   Medication Sig Start Date End Date Taking? Authorizing Provider  ALPRAZolam Prudy Feeler) 1 MG tablet 1 po before meals, 2 po prn panic attacks 05/20/15  Yes Morrell Riddle, PA-C   clonazePAM (KLONOPIN) 1 MG tablet Take 1 tablet (1 mg total) by mouth at bedtime. 1-1.5mg  in the am  at night 05/20/15  Yes Morrell Riddle, PA-C  escitalopram (LEXAPRO) 20 MG tablet Take 1 tablet (20 mg total) by mouth daily. 05/20/15  Yes Morrell Riddle, PA-C  traZODone (DESYREL) 50 MG tablet Take 0.5-3 tablets (25-150 mg total) by mouth at bedtime as needed for sleep. 05/20/15  Yes Morrell Riddle, PA-C    Allergies  Allergen Reactions  . Codeine Nausea And Vomiting    Past Surgical History  Procedure Laterality Date  . Wisdom tooth extraction      Social History  Substance Use Topics  . Smoking status: Current Every Day Smoker -- 1.00 packs/day  . Smokeless tobacco: None  . Alcohol Use: 0.0 oz/week    0 Standard drinks or equivalent per week     Comment: socially - beer sometimes daily - no binge drinking    History reviewed. No pertinent family history.  Medication list has been reviewed and updated.  Physical Examination:  Physical Exam  Constitutional: She is oriented to person, place, and time. She appears well-developed and well-nourished. No distress.  HENT:  Head: Normocephalic and atraumatic.  Right Ear: Hearing normal.  Left Ear: Hearing normal.  Nose: Nose normal.  Eyes: Conjunctivae and lids are normal. Right eye exhibits no discharge. Left  eye exhibits no discharge. No scleral icterus.  Pulmonary/Chest: Effort normal. No respiratory distress.  Musculoskeletal: Normal range of motion.       Cervical back: She exhibits tenderness (right paraspinal) and spasm (right paraspinal). She exhibits normal range of motion and no bony tenderness.  Mild tenderness right trapezius  Neurological: She is alert and oriented to person, place, and time.  Skin: Skin is warm, dry and intact. No lesion and no rash noted.  Psychiatric: She has a normal mood and affect. Her speech is normal and behavior is normal. Thought content normal.   BP 100/60 mmHg  Pulse 97  Temp(Src)  98.4 F (36.9 C) (Oral)  Resp 16  Wt 103 lb 6.4 oz (46.902 kg)  LMP 07/22/2015  Assessment and Plan:  1. Anorexia nervosa 2. Anxiety 3. insomnia Seems to be doing well - has gained 8 pounds since last visit. Feels the meds are working. No med changes at this time. Encouraged to make contact with Verlan Friends or another therapist soon. Make appt with nutritionist. Return in 2 months for follow up with Benny Lennert. - ALPRAZolam Prudy Feeler) 1 MG tablet; 1 po before meals, 2 po prn panic attacks  Dispense: 90 tablet; Refill: 0 - escitalopram (LEXAPRO) 20 MG tablet; Take 1 tablet (20 mg total) by mouth daily.  Dispense: 90 tablet; Refill: 1 - ALPRAZolam (XANAX) 1 MG tablet; 1 po before meals, 2 po prn panic attacks  Dispense: 90 tablet; Refill: 0 - clonazePAM (KLONOPIN) 1 MG tablet; Take 1 tablet (1 mg total) by mouth daily. 1-1.5mg  in the am  at night  Dispense: 30 tablet; Refill: 3 - traZODone (DESYREL) 50 MG tablet; Take 0.5-3 tablets (25-150 mg total) by mouth at bedtime as needed for sleep.  Dispense: 60 tablet; Refill: 1  4. Neck muscle spasm Treat with massage, heat, nsaids, flexeril. Advised take flexeril QHS - take instead of trazodone. May restart trazodone when symptoms improve. - cyclobenzaprine (FLEXERIL) 5 MG tablet; Take 1 tablet (5 mg total) by mouth 3 (three) times daily as needed for muscle spasms.  Dispense: 30 tablet; Refill: 0   Roswell Miners. Dyke Brackett, MHS [facility]  07/29/2015

## 2015-08-04 ENCOUNTER — Telehealth: Payer: Self-pay
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
## 2015-08-05 NOTE — Telephone Encounter (Signed)
Pt called to check the status of request/// pt was advised of last providers walk in hrs.
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
## 2015-08-06 ENCOUNTER — Ambulatory Visit (INDEPENDENT_AMBULATORY_CARE_PROVIDER_SITE_OTHER): Payer: BLUE CROSS/BLUE SHIELD | Admitting: Physician Assistant

## 2015-08-06 VITALS — BP 104/60 | HR 95 | Temp 98.2°F | Resp 16 | Ht 64.0 in | Wt 106.8 lb

## 2015-08-06 DIAGNOSIS — F419 Anxiety disorder, unspecified: Secondary | ICD-10-CM

## 2015-08-06 DIAGNOSIS — F5000 Anorexia nervosa, unspecified: Secondary | ICD-10-CM | POA: Diagnosis not present

## 2015-08-06 MED ORDER — ALPRAZOLAM 1 MG PO TABS: ORAL_TABLET | ORAL | Status: DC

## 2015-08-06 NOTE — Patient Instructions (Signed)
[facility] Policy for Prescribing Controlled Substances (Revised 04/2012) 1. Prescriptions for controlled substances will be filled by ONE provider at [facility] with whom you have established and developed a plan for your care, including follow-up. 2. You are encouraged to schedule an appointment with your prescriber at our appointment center for follow-up visits whenever possible. 3. If you request a prescription for the controlled substance while at [facility] for an acute problem (with someone other than your regular prescriber), you MAY be given a ONE-TIME prescription for a 30-day supply of the controlled substance, to allow time for you to return to see your regular prescriber for additional prescriptions. 
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
## 2015-10-09 ENCOUNTER — Telehealth: Payer: Self-pay

## 2015-10-09 NOTE — Telephone Encounter (Signed)
Pt needs a refill on escitalopram (LEXAPRO) 20 MG tablet. She will be out before her appointment on June 8 with Weber.   Please advise  [phone]

## 2015-10-10 NOTE — Telephone Encounter (Signed)
Pharm should already have a 90 day RF left from the 2/12 Rx. Tried to call pt to advise to check with the pharm, but Vm hadn't been set up. If pt calls back have her call pharm.

## 2015-10-12 ENCOUNTER — Other Ambulatory Visit: Payer: Self-pay | Admitting: Physician Assistant

## 2015-10-14 ENCOUNTER — Other Ambulatory Visit: Payer: Self-pay | Admitting: *Deleted

## 2015-10-14 DIAGNOSIS — F419 Anxiety disorder, unspecified: Secondary | ICD-10-CM

## 2015-10-14 MED ORDER — ESCITALOPRAM OXALATE 20 MG PO TABS: 20.0000 mg | ORAL_TABLET | Freq: Every day | ORAL | Status: DC

## 2015-11-13 ENCOUNTER — Ambulatory Visit: Payer: BLUE CROSS/BLUE SHIELD | Admitting: Physician Assistant

## 2015-11-20 ENCOUNTER — Ambulatory Visit (INDEPENDENT_AMBULATORY_CARE_PROVIDER_SITE_OTHER): Payer: BLUE CROSS/BLUE SHIELD | Admitting: Physician Assistant

## 2015-11-20 VITALS — BP 100/70 | HR 94 | Temp 98.7°F | Resp 16 | Ht 64.0 in | Wt 100.0 lb

## 2015-11-20 DIAGNOSIS — J309 Allergic rhinitis, unspecified: Secondary | ICD-10-CM

## 2015-11-20 DIAGNOSIS — F419 Anxiety disorder, unspecified: Secondary | ICD-10-CM | POA: Diagnosis not present

## 2015-11-20 DIAGNOSIS — G4700 Insomnia, unspecified: Secondary | ICD-10-CM

## 2015-11-20 DIAGNOSIS — F5000 Anorexia nervosa, unspecified: Secondary | ICD-10-CM

## 2015-11-20 DIAGNOSIS — M6248 Contracture of muscle, other site: Secondary | ICD-10-CM

## 2015-11-20 DIAGNOSIS — M62838 Other muscle spasm: Secondary | ICD-10-CM

## 2015-11-20 MED ORDER — ESCITALOPRAM OXALATE 20 MG PO TABS: 30.0000 mg | ORAL_TABLET | Freq: Every day | ORAL | Status: DC

## 2015-11-20 MED ORDER — ALPRAZOLAM 1 MG PO TABS: ORAL_TABLET | ORAL | Status: DC

## 2015-11-20 MED ORDER — TRAZODONE HCL 50 MG PO TABS: 50.0000 mg | ORAL_TABLET | Freq: Every evening | ORAL | Status: DC | PRN

## 2015-11-20 MED ORDER — MOMETASONE FUROATE 50 MCG/ACT NA SUSP: 2.0000 | Freq: Every day | NASAL | Status: DC

## 2015-11-20 MED ORDER — CYCLOBENZAPRINE HCL 5 MG PO TABS: 5.0000 mg | ORAL_TABLET | Freq: Three times a day (TID) | ORAL | Status: DC | PRN

## 2015-11-20 MED ORDER — CLONAZEPAM 1 MG PO TABS: 1.0000 mg | ORAL_TABLET | Freq: Every day | ORAL | Status: DC | PRN

## 2015-11-20 NOTE — Progress Notes (Signed)
Lindsay Freeman  MRN: [id number] DOB: Dec 27, 1995  Subjective:  Pt presents to clinic for recheck and medication refill.  She states that she is doing much better on the Lexparo - she feels less sad and less emotional but her anxiety is still bad and she is still having panic attacks but at this time she does not want to change medication.  She has not had her Klonopin or xanax in a few months and she feels like she is doing ok with her eating but not great.  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 She has been glad to have the Flexeril because her back muscles have been hurting because of bending over the pottery wheel.  She has noticed that her panic attacks since on the Lexapro are less severe and not as often but they are still debilitating when she has them. She is sleeping better on the trazodone - she is able to get to sleep and stay asleep longer - she still wakes at 5 am but will go back to sleep easier - she has nightmares and feels like the trazodone does not help those.    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 Anorexia nervosa 02/28/2015  . Anxiety 02/28/2015  . Insomnia 02/28/2015  . Hemorrhagic ovarian cyst 09/07/2014    No current outpatient prescriptions  on file prior to visit.   No current facility-administered medications on file prior to visit.    Allergies  Allergen Reactions  . Codeine Nausea And Vomiting    Review of Systems  Constitutional: Positive for fever (subjective). Negative for unexpected weight change.  Musculoskeletal: Positive for myalgias.  Psychiatric/Behavioral: Positive for sleep disturbance and dysphoric mood. Negative for decreased concentration. The patient is nervous/anxious.    Objective:  BP 100/70 mmHg  Pulse 94  Temp(Src) 98.7 F (37.1 C) (Oral)  Resp 16  Ht 5\' 4"  (1.626 m)  Wt 100 lb (45.36 kg)  BMI 17.16 kg/m2  SpO2 98%  LMP 11/06/2015  Physical Exam  Constitutional: She is oriented to person, place, and time and well-developed, well-nourished, and in no distress.  HENT:  Head: Normocephalic and atraumatic.  Right Ear: Hearing, tympanic membrane, external ear and ear canal normal.  Left Ear: Hearing, tympanic membrane, external ear and ear canal normal.  Nose: Mucosal edema (pale and swollen) present.  Mouth/Throat: Uvula is midline, oropharynx is clear and moist and mucous membranes are normal.  Eyes: Conjunctivae are normal.  Neck: Normal range of motion.  Cardiovascular: Normal rate, regular rhythm and normal heart sounds.   No murmur heard. Pulmonary/Chest: Effort normal and breath sounds normal.  Neurological: She is alert and oriented to person,  place, and time. Gait normal.  Skin: Skin is warm and dry.  Psychiatric: Mood, memory, affect and judgment normal.  Vitals reviewed.   Assessment and Plan :  Allergic rhinitis, unspecified allergic rhinitis type - Plan: mometasone (NASONEX) 50 MCG/ACT nasal spray - pt will restart her claritin and we will try a nasal spray to help her current allergy symptoms  Insomnia - Plan: traZODone (DESYREL) 50 MG tablet,  - continue the medication  Anxiety - Plan: escitalopram (LEXAPRO) 20 MG tablet, clonazePAM (KLONOPIN) 1 MG tablet - we will  increase her Lexapro today - she likely needs additional medications for her anxiety but she is not interested in change in this medication today - she will use the klonopin for increase anxiety during the day and at night - she will use one a day and during a time of day where her anxiety is worse and she states that this changes based on the day   Neck muscle spasm - Plan: cyclobenzaprine (FLEXERIL) 5 MG tablet - will give a refill as this has helped her esp with sleep  Anorexia nervosa - Plan: ALPRAZolam (XANAX) 1 MG tablet - she will continue to take this prior to eating - her goal is 2 meals a day - we talked about that her meals are mainly carbs and she should try to get in some protein - we also discussed that I am happy to continue with her treatment but the only way she is going to get better is with therapy - names were given to her - recheck with me in 6 weeks to see if the increase in Lexapro helps  Benny Lennert PA-C  [facility] 11/20/2015 4:07 PM

## 2015-11-20 NOTE — Patient Instructions (Addendum)
  [facility] -  Address: [address], [place], [place] [address]  Phone: [phone]  [facility] Phone [phone] Email [email or web address] Address [address] [place], [place] [phone]  Therapy for anxiety For therapy -- [facility] ([address], [place]) [phone]        IF you received an x-ray today, you will receive an invoice from [facility]. Please contact [facility] at [phone] with questions or concerns regarding your invoice.   IF you received labwork today, you will receive an invoice from [facility]. Please contact [facility] at [phone] with questions or concerns regarding your invoice.   Our billing staff will not be able to assist you with questions regarding bills from these companies.  You will be contacted with the lab results as soon as they are available. The fastest way to get your results is to activate your My Chart account. Instructions are located on the last page of this paperwork. If you have not heard from us regarding the results in 2 weeks, please contact this office.

## 2016-02-14 ENCOUNTER — Other Ambulatory Visit: Payer: Self-pay | Admitting: Physician Assistant

## 2016-02-14 DIAGNOSIS — G4700 Insomnia, unspecified: Secondary | ICD-10-CM

## 2016-02-25 ENCOUNTER — Other Ambulatory Visit: Payer: Self-pay | Admitting: Physician Assistant

## 2016-02-25 DIAGNOSIS — F419 Anxiety disorder, unspecified: Secondary | ICD-10-CM

## 2016-02-27 ENCOUNTER — Other Ambulatory Visit: Payer: Self-pay

## 2016-02-27 DIAGNOSIS — F419 Anxiety disorder, unspecified: Secondary | ICD-10-CM

## 2016-02-27 NOTE — Telephone Encounter (Signed)
Patient stated the wrong prescription was called in. Patient stated escitalopram (LEXAPRO) 20 MG tablet need to be called into [facility].

## 2016-02-28 MED ORDER — ESCITALOPRAM OXALATE 20 MG PO TABS: 30.0000 mg | ORAL_TABLET | Freq: Every day | ORAL | 0 refills | Status: DC

## 2016-02-28 NOTE — Telephone Encounter (Signed)
Patient has requested Lexapro, I have pended, but it looks like you wanted her to follow up in 6 weeks, which has passed. Ok to refill and advise her she needs follow up ?

## 2016-02-28 NOTE — Telephone Encounter (Signed)
Done - I need to see the patient this month

## 2016-03-01 NOTE — Telephone Encounter (Signed)
Tried to call pt to advise of need for f/up for more RFs, but VM was not set up.

## 2016-03-02 NOTE — Telephone Encounter (Signed)
Notified pt of need for f/up for more. Pt agreed.

## 2016-05-25 ENCOUNTER — Ambulatory Visit (INDEPENDENT_AMBULATORY_CARE_PROVIDER_SITE_OTHER): Payer: BLUE CROSS/BLUE SHIELD | Admitting: Physician Assistant

## 2016-05-25 DIAGNOSIS — F5000 Anorexia nervosa, unspecified: Secondary | ICD-10-CM | POA: Diagnosis not present

## 2016-05-25 DIAGNOSIS — F5105 Insomnia due to other mental disorder: Secondary | ICD-10-CM | POA: Diagnosis not present

## 2016-05-25 DIAGNOSIS — G4700 Insomnia, unspecified: Secondary | ICD-10-CM

## 2016-05-25 DIAGNOSIS — F99 Mental disorder, not otherwise specified: Secondary | ICD-10-CM

## 2016-05-25 DIAGNOSIS — M62838 Other muscle spasm: Secondary | ICD-10-CM

## 2016-05-25 DIAGNOSIS — F419 Anxiety disorder, unspecified: Secondary | ICD-10-CM

## 2016-05-25 MED ORDER — TRAZODONE HCL 50 MG PO TABS: 25.0000 mg | ORAL_TABLET | Freq: Every evening | ORAL | 0 refills | Status: DC | PRN

## 2016-05-25 MED ORDER — ALPRAZOLAM 1 MG PO TABS: ORAL_TABLET | ORAL | 0 refills | Status: DC

## 2016-05-25 MED ORDER — CLONAZEPAM 1 MG PO TABS: 1.0000 mg | ORAL_TABLET | Freq: Every day | ORAL | 0 refills | Status: DC | PRN

## 2016-05-25 MED ORDER — CYCLOBENZAPRINE HCL 5 MG PO TABS: 5.0000 mg | ORAL_TABLET | Freq: Three times a day (TID) | ORAL | 0 refills | Status: AC | PRN

## 2016-05-25 MED ORDER — ESCITALOPRAM OXALATE 20 MG PO TABS: 30.0000 mg | ORAL_TABLET | Freq: Every day | ORAL | 0 refills | Status: DC

## 2016-05-25 NOTE — Patient Instructions (Signed)
     IF you received an x-ray today, you will receive an invoice from [facility]. Please contact [facility] at [phone] with questions or concerns regarding your invoice.   IF you received labwork today, you will receive an invoice from [facility]. Please contact [facility] at [phone] with questions or concerns regarding your invoice.   Our billing staff will not be able to assist you with questions regarding bills from these companies.  You will be contacted with the lab results as soon as they are available. The fastest way to get your results is to activate your My Chart account. Instructions are located on the last page of this paperwork. If you have not heard from us regarding the results in 2 weeks, please contact this office.     

## 2016-05-25 NOTE — Progress Notes (Signed)
Lindsay Freeman  MRN: [id number] DOB: 06/18/1995  Subjective:  Pt presents to clinic for medication refill.  She recently moved and cannot find all her Rx.  She needs the lexapro - she is leaving for vacation and she will run out while she is away.  She finds that the trazadone helps with her sleep but she is to groggy on 50mg  to go to class - she has not tried 25mg  - she is unsure where that bottle is located and would like a refill of that.  Klonopin - helps her sleep netter than the trazodone - she has been out for a while when she has the medication she finds that she takes it 2-3 times a week  Xanax - takes it before a meal with a lot of people Flexeril - takes rarely - plans to see a chiropractor for her back pain but when it really hurts this helps her pain  Therapist - [employer] [facility] - generalized anxiety - feels like it is helping - they know about eating disorder but that is not what they are working on Nutritionist - has not seen in a long time  Eating is about the same - she is currently not receiving treatment for this disorder - still restricts - eats small amounts of food few times a day - does not like eating around people  Review of Systems  Psychiatric/Behavioral: Positive for sleep disturbance. The patient is nervous/anxious.     Patient Active Problem List   Diagnosis Date Noted  . 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 Anorexia nervosa 02/28/2015  . Anxiety 02/28/2015  . Insomnia 02/28/2015  . Hemorrhagic ovarian cyst 09/07/2014    No current outpatient prescriptions on file prior to visit.   No current facility-administered medications on file prior to visit.     Allergies  Allergen Reactions  . Codeine Nausea And Vomiting    Pt patients past, family and social history were reviewed and updated.   Objective:  BP 102/72 (BP Location: Right Arm, Patient Position: Sitting, Cuff Size: Normal)   Pulse 81   Temp 98.3 F (36.8 C) (Oral)    Resp 17   Ht 5\' 4"  (1.626 m)   Wt 107 lb (48.5 kg)   SpO2 98%   BMI 18.37 kg/m   Physical Exam  Constitutional: She is oriented to person, place, and time and well-developed, well-nourished, and in no distress.  HENT:  Head: Normocephalic and atraumatic.  Right Ear: Hearing and external ear normal.  Left Ear: Hearing and external ear normal.  Eyes: Conjunctivae are normal.  Neck: Normal range of motion.  Pulmonary/Chest: Effort normal.  Neurological: She is alert and oriented to person, place, and time. Gait normal.  Skin: Skin is warm and dry.  Psychiatric: Mood, memory, affect and judgment normal.  Vitals reviewed.  Assessment and Plan :  Insomnia, unspecified type - Plan: traZODone (DESYREL) 50 MG tablet - trial at a lower dose - if this still makes her groggy we ill try remeron - this may also help to stimulate her appetite but we will have to monitor her weight carefull because if it causes weight gain rapidly this may not be good for the patient's disordered eating.  Anxiety - Plan: escitalopram (LEXAPRO) 20 MG tablet, ALPRAZolam (XANAX) 1 MG tablet - continue current medications - pt feels like her anxiety is better controlled though with the fact that her disordered eating is still occurring I am doubtful - it would be very beneficial  to the patient to be in therapy with someone who specialized in eating disorders and GAD but at least she is going to therapy and due to her being a student school counseling is a better option than nothing.  Neck muscle spasm - Plan: cyclobenzaprine (FLEXERIL) 5 MG tablet - use as needed  Insomnia due to other mental disorder - Plan: clonazePAM (KLONOPIN) 1 MG tablet  Anorexia nervosa - Plan: ALPRAZolam (XANAX) 1 MG tablet - uncontrolled - pt uses rarely  D/w pt that she needs more consistent care to be able to help her - she needs to find a provider that she likes and continue to see them.  She voiced understanding.  Benny LennertSarah Weber PA-C  [facility] 05/25/2016 2:27 PM

## 2016-09-19 ENCOUNTER — Other Ambulatory Visit: Payer: Self-pay | Admitting: Physician Assistant

## 2016-09-19 DIAGNOSIS — F419 Anxiety disorder, unspecified: Secondary | ICD-10-CM

## 2016-10-21 ENCOUNTER — Emergency Department (HOSPITAL_COMMUNITY)
Admission: EM | Admit: 2016-10-21 | Discharge: 2016-10-21 | Disposition: A | Discharge status: Home / Self Care | Payer: BLUE CROSS/BLUE SHIELD | Attending: Emergency Medicine | Admitting: Emergency Medicine

## 2016-10-21 ENCOUNTER — Encounter (HOSPITAL_COMMUNITY): Payer: Self-pay | Admitting: Emergency Medicine

## 2016-10-21 DIAGNOSIS — Y929 Unspecified place or not applicable: Secondary | ICD-10-CM | POA: Diagnosis not present

## 2016-10-21 DIAGNOSIS — Z79899 Other long term (current) drug therapy: Secondary | ICD-10-CM | POA: Insufficient documentation

## 2016-10-21 DIAGNOSIS — Y939 Activity, unspecified: Secondary | ICD-10-CM | POA: Insufficient documentation

## 2016-10-21 DIAGNOSIS — F17200 Nicotine dependence, unspecified, uncomplicated: Secondary | ICD-10-CM | POA: Diagnosis not present

## 2016-10-21 DIAGNOSIS — Y999 Unspecified external cause status: Secondary | ICD-10-CM | POA: Diagnosis not present

## 2016-10-21 DIAGNOSIS — W57XXXA Bitten or stung by nonvenomous insect and other nonvenomous arthropods, initial encounter: Secondary | ICD-10-CM | POA: Diagnosis not present

## 2016-10-21 DIAGNOSIS — S80861A Insect bite (nonvenomous), right lower leg, initial encounter: Secondary | ICD-10-CM | POA: Insufficient documentation

## 2016-10-21 NOTE — ED Triage Notes (Signed)
Pt states something bit her on the legs a couple of days ago  Did not see what it was that bit her but now she has red areas that are spreading  The areas are warm to the touch  Pt has one on her right calf, one on her right inner thigh, and 2 on her left calf

## 2016-10-21 NOTE — Discharge Instructions (Signed)
It was my pleasure taking care of you today!   Benadryl every 6 hours as needed for itching. Hydrocortisone cream to the affected area. Please follow up with your primary doctor on Monday if symptoms persist. Return to the ER for fever, new or worsening symptoms, any additional concerns.

## 2016-10-21 NOTE — ED Provider Notes (Signed)
[facility]-EMERGENCY DEPT Provider Note   CSN: [id number] Arrival date & time: 10/21/16  1942 By signing my name below, I, Levon Hedger, attest that this documentation has been prepared under the direction and in the presence of non-physician practitioner, Elizabeth Sauer, PA-C. Electronically Signed: Levon Hedger, Scribe. 10/21/2016. 8:48 PM.   History   Chief Complaint Chief Complaint  Patient presents with  . Insect Bite   HPI Lindsay Freeman is a 21 y.o. female who presents to the Emergency Department complaining of progressively worsening, itching areas of erythema to her right lateral leg onset a few days ago. Per pt, this initially appeared to be a small insect bite, but the erythema spread significantly after showering today. No OTC treatments tried for these symptoms PTA.  Pt denies any fever or other systemic symptoms. No purulent drainage and has no other acute complaints at this time.    The history is provided by the patient. No language interpreter was used.    Past Medical History:  Diagnosis Date  . Ovarian cyst     Patient Active Problem List   Diagnosis Date Noted  . 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 Anorexia nervosa 02/28/2015  . Anxiety 02/28/2015  . Insomnia 02/28/2015  . Hemorrhagic ovarian cyst 09/07/2014    Past Surgical History:  Procedure Laterality Date  . WISDOM TOOTH EXTRACTION      OB History    Gravida Para Term Preterm AB Living   0 0 0 0 0 0   SAB TAB Ectopic Multiple Live Births   0 0 0 0       Home Medications    Prior to Admission medications   Medication Sig Start Date End Date Taking? Authorizing Provider  ALPRAZolam Prudy Feeler) 1 MG tablet 1 po before meals 05/25/16   Weber, Sarah L, PA-C  clonazePAM (KLONOPIN) 1 MG tablet Take 1 tablet (1 mg total) by mouth daily as needed for anxiety. 05/25/16   Valarie Cones, Dema Severin, PA-C  cyclobenzaprine (FLEXERIL) 5 MG tablet Take 1 tablet (5 mg total) by mouth 3 (three) times daily as needed for muscle  spasms. 05/25/16   Weber, Sarah L, PA-C  escitalopram (LEXAPRO) 20 MG tablet TAKE ONE AND ONE-HALF TABLET (30 MG TOTAL) BY MOUTH DAILY. 09/21/16   Weber, Dema Severin, PA-C  traZODone (DESYREL) 50 MG tablet Take 0.5 tablets (25 mg total) by mouth at bedtime as needed. for sleep 05/25/16   Valarie Cones Sharene Skeans    Family History History reviewed. No pertinent family history.  Social History Social History  Substance Use Topics  . Smoking status: Current Every Day Smoker    Packs/day: 1.00  . Smokeless tobacco: Never Used  . Alcohol use 0.0 oz/week     Comment: socially - beer sometimes daily - no binge drinking    Allergies   Codeine and Hydrocodone  Review of Systems Review of Systems  Constitutional: Negative for fever.  Skin: Positive for color change and rash.    Physical Exam Updated Vital Signs BP 122/79 (BP Location: Right Arm)   Pulse 88   Temp 98.7 F (37.1 C) (Oral)   Resp 20   LMP 10/14/2016 (Exact Date)   SpO2 100%   Physical Exam  Constitutional: She appears well-developed and well-nourished. No distress.  HENT:  Head: Normocephalic and atraumatic.  Neck: Neck supple.  Cardiovascular: Normal rate, regular rhythm and normal heart sounds.   No murmur heard. Pulmonary/Chest: Effort normal and breath sounds normal. No respiratory distress. She has no  wheezes. She has no rales.  Musculoskeletal: Normal range of motion.  Neurological: She is alert.  Skin: Skin is warm and dry.  Right leg with 2 approximately 10 cm wheels consistent with insect bite.   Nursing note and vitals reviewed.  ED Treatments / Results  DIAGNOSTIC STUDIES:  Oxygen Saturation is 100% on RA, normal by my interpretation.    COORDINATION OF CARE:  8:47 PM Discussed treatment plan with pt at bedside and pt agreed to plan.   Labs (all labs ordered are listed, but only abnormal results are displayed) Labs Reviewed - No data to display  EKG  EKG Interpretation None       Radiology No results found.  Procedures Procedures (including critical care time)  Medications Ordered in ED Medications - No data to display   Initial Impression / Assessment and Plan / ED Course  I have reviewed the triage vital signs and the nursing notes.  Pertinent labs & imaging results that were available during my care of the patient were reviewed by me and considered in my medical decision making (see chart for details).    Lindsay Freeman is a 21 y.o. female who presents to ED for insect bites. No concern for super-imposed infection, however spoke at length about sxs to suggest infection / return to ER. Hydrocortisone cream to area. Benadryl as needed. PCP follow up if symptoms persist. All questions answered.    Final Clinical Impressions(s) / ED Diagnoses   Final diagnoses:  Insect bite, initial encounter    New Prescriptions New Prescriptions   No medications on file   I personally performed the services described in this documentation, which was scribed in my presence. The recorded information has been reviewed and is accurate.    Ward, Chase PicketJaime Pilcher, PA-C 10/21/16 2100    Raeford RazorKohut, Stephen, MD 10/30/16 [phone]

## 2016-12-24 ENCOUNTER — Telehealth: Payer: Self-pay

## 2016-12-24 NOTE — Telephone Encounter (Signed)
Lexepro refill; advised John (significant other) that this rx will most likely require an OV.   Pharm: [facility]    [phone] please call to advise

## 2016-12-24 NOTE — Telephone Encounter (Signed)
Attempted to call, the VM Box was not set up yet. Unable to leave a message.

## 2016-12-29 ENCOUNTER — Ambulatory Visit (INDEPENDENT_AMBULATORY_CARE_PROVIDER_SITE_OTHER): Payer: BLUE CROSS/BLUE SHIELD | Admitting: Physician Assistant

## 2016-12-29 VITALS — BP 115/71 | HR 80 | Temp 98.8°F | Resp 16 | Ht 64.5 in | Wt 107.0 lb

## 2016-12-29 DIAGNOSIS — F5000 Anorexia nervosa, unspecified: Secondary | ICD-10-CM

## 2016-12-29 DIAGNOSIS — F5105 Insomnia due to other mental disorder: Secondary | ICD-10-CM | POA: Diagnosis not present

## 2016-12-29 DIAGNOSIS — F419 Anxiety disorder, unspecified: Secondary | ICD-10-CM | POA: Diagnosis not present

## 2016-12-29 DIAGNOSIS — F99 Mental disorder, not otherwise specified: Secondary | ICD-10-CM

## 2016-12-29 MED ORDER — ESCITALOPRAM OXALATE 20 MG PO TABS: ORAL_TABLET | ORAL | 5 refills | Status: AC

## 2016-12-29 NOTE — Patient Instructions (Addendum)
Let me know if you need any information.      IF you received an x-ray today, you will receive an invoice from [facility]. Please contact [facility] at [phone] with questions or concerns regarding your invoice.   IF you received labwork today, you will receive an invoice from [facility]. Please contact [facility] at [phone] with questions or concerns regarding your invoice.   Our billing staff will not be able to assist you with questions regarding bills from these companies.  You will be contacted with the lab results as soon as they are available. The fastest way to get your results is to activate your My Chart account. Instructions are located on the last page of this paperwork. If you have not heard from us regarding the results in 2 weeks, please contact this office.

## 2016-12-29 NOTE — Progress Notes (Signed)
[facility] [address], [place] [phone]  Date:  12/29/2016   Name:  Lindsay Freeman   DOB:  1995-08-11   MRN:  [id number]  PCP:  System, Pcp Not In    History of Present Illness:  Lindsay Freeman is a 21 y.o. female patient who presents to PCP with  Chief Complaint  Patient presents with  . Medication Refill    Lexapro     Patient is here for her lexapro.  She has only missed 1 day of her dosing.  She has not noticed negative side effects to the medication.    Insomnia: this is not doing well, but she does reports she can have days where she is sleeping.  She has tried benadryl which helps her get to sleep but she manages to wake up in the middle of the night.  She notes that it is easier for her to get back to sleep.   She is currently not taking any klonopin for her sleep.  She also does not take the xanax for anxiety with large crowds. She is not using the trazodone as well.  She states that she now avoids them.   She reports that she has gained some weight though does not know how much she weighs or if wants to know.  Her parents are looking into an inpatient eating disorder treatment.  They have list--deciding whether to be instate vs not.  She may skip a semester to pursue this.   Currently, she lives with her boyfriend.  She notes that she will talk to  Him if she has depressive symptoms.  She will also talk to her parents whom she states that she has a good relationship with, or her friend Lindsay Freeman.  She has no plan of SI.  But struggles with thoughts, though she reports "there no more abnormal than anyone else".  [facility] [employer] [facility] anxiety - feels like it is helping - they know about eating disorder but that is not what they are working on Nutritionist - has not seen in a long time  Eating is about the same - she is currently not receiving treatment for this disorder - still restricts - eats small amounts of  food few times a day - does not like eating around people    Patient Active Problem List   Diagnosis Date Noted  . 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 Anorexia nervosa 02/28/2015  . Anxiety 02/28/2015  . Insomnia 02/28/2015  . Hemorrhagic ovarian cyst 09/07/2014    Past Medical History:  Diagnosis Date  . Ovarian cyst     Past Surgical History:  Procedure Laterality Date  . WISDOM TOOTH EXTRACTION      Social History  Substance Use Topics  . Smoking status: Current Every Day Smoker    Packs/day: 1.00  . Smokeless tobacco: Never Used  . Alcohol use 0.0 oz/week     Comment: socially - beer sometimes daily - no binge drinking    No family history on file.  Allergies  Allergen Reactions  . Codeine Nausea And Vomiting  . Hydrocodone Nausea And Vomiting    Medication list has been reviewed and updated.  Current Outpatient Prescriptions on File Prior to Visit  Medication Sig Dispense Refill  . escitalopram (LEXAPRO) 20 MG tablet TAKE ONE AND ONE-HALF TABLET (30 MG TOTAL) BY MOUTH DAILY. 135 tablet 0  . ALPRAZolam (XANAX) 1 MG tablet 1 po before meals (Patient not  taking: Reported on 12/29/2016) 30 tablet 0  . clonazePAM (KLONOPIN) 1 MG tablet Take 1 tablet (1 mg total) by mouth daily as needed for anxiety. (Patient not taking: Reported on 12/29/2016) 30 tablet 0  . cyclobenzaprine (FLEXERIL) 5 MG tablet Take 1 tablet (5 mg total) by mouth 3 (three) times daily as needed for muscle spasms. (Patient not taking: Reported on 12/29/2016) 30 tablet 0  . traZODone (DESYREL) 50 MG tablet Take 0.5 tablets (25 mg total) by mouth at bedtime as needed. for sleep (Patient not taking: Reported on 12/29/2016) 30 tablet 0   No current facility-administered medications on file prior to visit.     ROS ROS otherwise unremarkable unless listed above.  Physical Examination: BP 115/71 (BP Location: Right Arm, Patient Position: Sitting, Cuff Size: Normal)   Pulse 80   Temp 98.8 F (37.1  C) (Oral)   Resp 16   Ht 5' 4.5" (1.638 m)   Wt 107 lb (48.5 kg)   LMP 11/05/2016   SpO2 97%   BMI 18.08 kg/m  Ideal Body Weight: Weight in (lb) to have BMI = 25: 147.6  Physical Exam  Constitutional: She is oriented to person, place, and time. She appears well-developed and well-nourished. No distress.  HENT:  Head: Normocephalic and atraumatic.  Right Ear: External ear normal.  Left Ear: External ear normal.  Eyes: Pupils are equal, round, and reactive to light. Conjunctivae and EOM are normal.  Cardiovascular: Normal rate.   Pulmonary/Chest: Effort normal. No respiratory distress.  Neurological: She is alert and oriented to person, place, and time.  Skin: She is not diaphoretic.  Psychiatric: She has a normal mood and affect. Her behavior is normal.     Assessment and Plan: Lindsay Freeman is a 21 y.o. female who is here today for cc of medication refill and recheck of mood disorder and anorexia Offered information for eating disorder clinics.  She is declining at this time.   Refilled.   Insomnia due to other mental disorder  Anxiety - Plan: escitalopram (LEXAPRO) 20 MG tablet  Anorexia nervosa  Trena PlattStephanie Joana Nolton, PA-C [facility] 8/2/20186:38 AM

## 2016-12-29 NOTE — Telephone Encounter (Signed)
Refill done on 7/25

## 2017-01-06 ENCOUNTER — Encounter: Payer: Self-pay | Admitting: Physician Assistant

## 2017-02-21 ENCOUNTER — Ambulatory Visit (INDEPENDENT_AMBULATORY_CARE_PROVIDER_SITE_OTHER): Payer: BLUE CROSS/BLUE SHIELD | Admitting: Physician Assistant

## 2017-02-21 ENCOUNTER — Encounter: Payer: Self-pay | Admitting: Physician Assistant

## 2017-02-21 VITALS — BP 115/73 | HR 96 | Temp 98.0°F | Resp 18 | Ht 64.5 in

## 2017-02-21 DIAGNOSIS — Z1389 Encounter for screening for other disorder: Secondary | ICD-10-CM

## 2017-02-21 DIAGNOSIS — Z130 Encounter for screening for diseases of the blood and blood-forming organs and certain disorders involving the immune mechanism: Secondary | ICD-10-CM

## 2017-02-21 DIAGNOSIS — Z0000 Encounter for general adult medical examination without abnormal findings: Secondary | ICD-10-CM | POA: Diagnosis not present

## 2017-02-21 DIAGNOSIS — Z1329 Encounter for screening for other suspected endocrine disorder: Secondary | ICD-10-CM | POA: Diagnosis not present

## 2017-02-21 DIAGNOSIS — Z13228 Encounter for screening for other metabolic disorders: Secondary | ICD-10-CM

## 2017-02-21 DIAGNOSIS — Z136 Encounter for screening for cardiovascular disorders: Secondary | ICD-10-CM

## 2017-02-21 DIAGNOSIS — Z1321 Encounter for screening for nutritional disorder: Secondary | ICD-10-CM

## 2017-02-21 DIAGNOSIS — F5000 Anorexia nervosa, unspecified: Secondary | ICD-10-CM | POA: Diagnosis not present

## 2017-02-21 DIAGNOSIS — Z0283 Encounter for blood-alcohol and blood-drug test: Secondary | ICD-10-CM

## 2017-02-21 DIAGNOSIS — Z1159 Encounter for screening for other viral diseases: Secondary | ICD-10-CM | POA: Diagnosis not present

## 2017-02-21 LAB — POC MICROSCOPIC URINALYSIS (UMFC): MUCUS RE: ABSENT

## 2017-02-21 LAB — POCT URINE PREGNANCY: PREG TEST UR: NEGATIVE

## 2017-02-21 NOTE — Patient Instructions (Signed)
We will contact you with the results.  I want you to follow up with the tb test.  Likely will have the skin test within the next 2 days.

## 2017-02-21 NOTE — Progress Notes (Signed)
PRIMARY CARE AT [place], [place] [address] [phone]  Date:  02/21/2017   Name:  Lindsay Freeman   DOB:  01/22/1996   MRN:  [id number]  PCP:  System, Pcp Not In    History of Present Illness:  Lindsay Freeman is a 21 y.o. female patient who presents to PCP with  Chief Complaint  Patient presents with  . Paperwork    Pt brought in paperwork to filled out by provider. Depression scale score 16     Patient is here to have paperwork completed.  She has decided to go inpatient [facility] for her anorexia nervosa.   She is deferring school She has no concerns or complaints at this time She will have a physical exam today.   Normal bm without blood in stool or black stool Living with boyfriend.  Feels safe in home.  He is here today for support.  No chest pains, palpitations, or sob.     Patient's last menstrual period was 02/17/2017.   Patient Active Problem List   Diagnosis Date Noted  . 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 Anorexia nervosa 02/28/2015  . Anxiety 02/28/2015  . Insomnia 02/28/2015  . Hemorrhagic ovarian cyst 09/07/2014    Past Medical History:  Diagnosis Date  . Ovarian cyst     Past Surgical History:  Procedure Laterality Date  . WISDOM TOOTH EXTRACTION      Social History  Substance Use Topics  . Smoking status: Current Every Day Smoker    Packs/day: 1.00  . Smokeless tobacco: Never Used  . Alcohol use 0.0 oz/week     Comment: socially - beer sometimes daily - no binge drinking    No family history on file.  Allergies  Allergen Reactions  . Codeine Nausea And Vomiting  . Hydrocodone Nausea And Vomiting    Medication list has been reviewed and updated.  Current Outpatient Prescriptions on File Prior to Visit  Medication Sig Dispense Refill  . escitalopram (LEXAPRO) 20 MG tablet TAKE ONE AND ONE-HALF TABLET (30 MG TOTAL) BY MOUTH DAILY. 135 tablet 5  . cyclobenzaprine (FLEXERIL) 5 MG tablet Take 1 tablet (5 mg  total) by mouth 3 (three) times daily as needed for muscle spasms. (Patient not taking: Reported on 12/29/2016) 30 tablet 0   No current facility-administered medications on file prior to visit.     Review of Systems  Constitutional: Negative for chills and fever.  HENT: Negative for ear discharge, ear pain and sore throat.   Eyes: Negative for blurred vision and double vision.  Respiratory: Negative for cough, shortness of breath and wheezing.   Cardiovascular: Negative for chest pain, palpitations and leg swelling.  Gastrointestinal: Negative for diarrhea, nausea and vomiting.  Genitourinary: Negative for dysuria, frequency and hematuria.  Skin: Negative for itching and rash.  Neurological: Negative for dizziness and headaches.   ROS otherwise unremarkable unless listed above.  Physical Examination: BP 115/73 (BP Location: Right Arm, Patient Position: Sitting, Cuff Size: Normal)   Pulse 96   Temp 98 F (36.7 C) (Oral)   Resp 18   Ht 5' 4.5" (1.638 m)   Wt 112 lb (50.8 kg)   LMP 02/17/2017   SpO2 95%   BMI 18.93 kg/m  Ideal Body Weight: Weight in (lb) to have BMI = 25: 147.6  Physical Exam  Constitutional: She is oriented to person, place, and time. She appears well-developed and well-nourished. No distress.  HENT:  Head: Normocephalic and atraumatic.  Right Ear:  External ear normal.  Left Ear: External ear normal.  Eyes: Pupils are equal, round, and reactive to light. Conjunctivae and EOM are normal.  Cardiovascular: Normal rate, regular rhythm, normal heart sounds and intact distal pulses.  Exam reveals no friction rub.   No murmur heard. Pulmonary/Chest: Effort normal. No respiratory distress. She has no wheezes.  Abdominal: Soft. Bowel sounds are normal.  Neurological: She is alert and oriented to person, place, and time.  Skin: Skin is warm and dry. Capillary refill takes less than 2 seconds. She is not diaphoretic.  Psychiatric: She has a normal mood and affect. Her  behavior is normal.     Assessment and Plan: Lindsay Freeman is a 21 y.o. female who is here today  Tb test and physical exam She will return for ppd Forms to be completed for entry to [facility] for anorexia nervosa She would like forms faxed directly.  This includes her weight which we do not share with her at her request Once tb is complete and toxassure should be able to send over.   Annual physical exam - Plan: CBC with Differential/Platelet, CMP14+EGFR, Phosphorus, Hepatitis panel, acute, TSH + free T4, T3, free, Magnesium, Amylase, Lipase, POCT urine pregnancy, VITAMIN D 25 Hydroxy (Vit-D Deficiency, Fractures), Vitamin B12, Ferritin, POCT Microscopic Urinalysis ([facility]), ToxASSURE Select 13 (MW), Urine, EKG 12-Lead  Screening for endocrine, nutritional, metabolic and immunity disorder - Plan: CMP14+EGFR, Phosphorus, Magnesium, Amylase, Lipase  Need for hepatitis B screening test - Plan: Hepatitis panel, acute  Need for hepatitis C screening test - Plan: Hepatitis panel, acute  Screening for blood or protein in urine - Plan: POCT Microscopic Urinalysis ([facility])  Screening for deficiency anemia - Plan: CBC with Differential/Platelet, Vitamin B12, Ferritin  Encounter for vitamin deficiency screening - Plan: VITAMIN D 25 Hydroxy (Vit-D Deficiency, Fractures)  Screening for iron deficiency anemia - Plan: Ferritin  Anorexia nervosa - Plan: Amylase, Lipase, POCT urine pregnancy  Screening for thyroid disorder - Plan: TSH + free T4, T3, free  Encounter for drug screening - Plan: ToxASSURE Select 13 (MW), Urine  Screening, heart disease, ischemic - Plan: EKG 12-Lead  Ivar Drape, PA-C [facility] 9/21/20189:09 AM

## 2017-02-22 LAB — CBC WITH DIFFERENTIAL/PLATELET
BASOS: 1 %
Basophils Absolute: 0.1 10*3/uL (ref 0.0–0.2)
EOS (ABSOLUTE): 0.1 10*3/uL (ref 0.0–0.4)
EOS: 1 %
HEMATOCRIT: 41.4 % (ref 34.0–46.6)
Hemoglobin: 13.7 g/dL (ref 11.1–15.9)
IMMATURE GRANS (ABS): 0 10*3/uL (ref 0.0–0.1)
Immature Granulocytes: 0 %
LYMPHS: 22 %
Lymphocytes Absolute: 1.3 10*3/uL (ref 0.7–3.1)
MCH: 33 pg (ref 26.6–33.0)
MCHC: 33.1 g/dL (ref 31.5–35.7)
MCV: 100 fL — AB (ref 79–97)
MONOS ABS: 0.8 10*3/uL (ref 0.1–0.9)
Monocytes: 13 %
NEUTROS ABS: 4 10*3/uL (ref 1.4–7.0)
Neutrophils: 63 %
PLATELETS: 297 10*3/uL (ref 150–379)
RBC: 4.15 x10E6/uL (ref 3.77–5.28)
RDW: 12.8 % (ref 12.3–15.4)
WBC: 6.2 10*3/uL (ref 3.4–10.8)

## 2017-02-22 LAB — CMP14+EGFR
A/G RATIO: 1.7 (ref 1.2–2.2)
ALT: 36 IU/L — ABNORMAL HIGH (ref 0–32)
AST: 32 IU/L (ref 0–40)
Albumin: 4.9 g/dL (ref 3.5–5.5)
Alkaline Phosphatase: 45 IU/L (ref 39–117)
BILIRUBIN TOTAL: 0.4 mg/dL (ref 0.0–1.2)
BUN / CREAT RATIO: 14 (ref 9–23)
BUN: 9 mg/dL (ref 6–20)
CALCIUM: 10.3 mg/dL — AB (ref 8.7–10.2)
CHLORIDE: 98 mmol/L (ref 96–106)
CO2: 24 mmol/L (ref 20–29)
Creatinine, Ser: 0.65 mg/dL (ref 0.57–1.00)
GFR, EST AFRICAN AMERICAN: 147 mL/min/{1.73_m2} (ref >59)
GFR, EST NON AFRICAN AMERICAN: 127 mL/min/{1.73_m2} (ref >59)
Globulin, Total: 2.9 g/dL (ref 1.5–4.5)
Glucose: 110 mg/dL — ABNORMAL HIGH (ref 65–99)
Potassium: 3.8 mmol/L (ref 3.5–5.2)
Sodium: 139 mmol/L (ref 134–144)
TOTAL PROTEIN: 7.8 g/dL (ref 6.0–8.5)

## 2017-02-22 LAB — FERRITIN: Ferritin: 80 ng/mL (ref 15–150)

## 2017-02-22 LAB — HEPATITIS PANEL, ACUTE
HEP B C IGM: NEGATIVE
HEP B S AG: NEGATIVE
Hep A IgM: NEGATIVE
Hep C Virus Ab: <0.1 s/co ratio (ref 0.0–0.9)

## 2017-02-22 LAB — PHOSPHORUS: Phosphorus: 2.2 mg/dL — ABNORMAL LOW (ref 2.5–4.5)

## 2017-02-22 LAB — T3, FREE: T3 FREE: 2.7 pg/mL (ref 2.0–4.4)

## 2017-02-22 LAB — TSH+FREE T4
Free T4: 1.02 ng/dL (ref 0.82–1.77)
TSH: 0.549 u[IU]/mL (ref 0.450–4.500)

## 2017-02-22 LAB — AMYLASE: Amylase: 37 U/L (ref 31–124)

## 2017-02-22 LAB — VITAMIN D 25 HYDROXY (VIT D DEFICIENCY, FRACTURES): VIT D 25 HYDROXY: 15.3 ng/mL — AB (ref 30.0–100.0)

## 2017-02-22 LAB — VITAMIN B12: Vitamin B-12: 271 pg/mL (ref 232–1245)

## 2017-02-22 LAB — MAGNESIUM: MAGNESIUM: 2.3 mg/dL (ref 1.6–2.3)

## 2017-02-22 LAB — LIPASE: LIPASE: 14 U/L (ref 14–72)

## 2017-02-23 ENCOUNTER — Ambulatory Visit (INDEPENDENT_AMBULATORY_CARE_PROVIDER_SITE_OTHER): Payer: BLUE CROSS/BLUE SHIELD | Admitting: Physician Assistant

## 2017-02-23 ENCOUNTER — Encounter: Payer: Self-pay | Admitting: Physician Assistant

## 2017-02-23 VITALS — BP 109/70 | HR 99 | Temp 98.6°F | Resp 16 | Ht 64.5 in | Wt 118.0 lb

## 2017-02-23 DIAGNOSIS — F4522 Body dysmorphic disorder: Secondary | ICD-10-CM | POA: Diagnosis not present

## 2017-02-23 DIAGNOSIS — Z111 Encounter for screening for respiratory tuberculosis: Secondary | ICD-10-CM | POA: Diagnosis not present

## 2017-02-23 NOTE — Patient Instructions (Signed)
TB SKIN TEST IS TO BE READ IN 48-72 HOURS  Tuberculosis Risk Questionnaire  1. No Were you born outside the [place] in one of the following parts of the world: [place], [place], [place], [place] or [place]?    2. No Have you traveled outside the [place] and lived for more than one month in one of the following parts of the world: [place], [place], [place], [place] or [place]?    3. No Do you have a compromised immune system such as from any of the following conditions:HIV/AIDS, organ or bone marrow transplantation, diabetes, immunosuppressive medicines (e.g. Prednisone, Remicaide), leukemia, lymphoma, cancer of the head or neck, gastrectomy or jejunal bypass, end-stage renal disease (on dialysis), or silicosis?     4. No Have you ever or do you plan on working in: a residential care center, a health care facility, a jail or prison or homeless shelter?    5. No Have you ever: injected illegal drugs, used crack cocaine, lived in a homeless shelter  or been in jail or prison?     6. No Have you ever been exposed to anyone with infectious tuberculosis?  7. No  Have you ever had a BCG vaccine? (BCG is a vaccine for tuberculosis  (TB) used in OTHER countries, NOT in the [place]).  8. No Have you ever been advised by a health care provider NOT to have a TB skin test?  9. No Have you ever had a POSITIVE TB skin test?  IF SO, when?   IF SO, were you treated with INH?   IF SO, where?   Tuberculosis Symptom Questionnaire  Do you currently have any of the following symptoms?  1. No Unexplained cough lasting more than 3 weeks?   2. No Unexplained fever lasting more than 3 weeks.   3. No Night Sweats (sweating that leaves the bedclothes and sheets wet)     4. No Shortness of Breath   5. No Chest Pain   6. No Unintentional weight loss    7. No Unexplained fatigue (very tired for no reason)    IF you received an x-ray today, you will receive an invoice from  [facility]. Please contact [facility] at [phone] with questions or concerns regarding your invoice.   IF you received labwork today, you will receive an invoice from [facility]. Please contact [facility] at [phone] with questions or concerns regarding your invoice.   Our billing staff will not be able to assist you with questions regarding bills from these companies.  You will be contacted with the lab results as soon as they are available. The fastest way to get your results is to activate your My Chart account. Instructions are located on the last page of this paperwork. If you have not heard from [place] regarding the results in 2 weeks, please contact this office.

## 2017-02-23 NOTE — Progress Notes (Signed)
Lindsay Freeman  MRN: [id number] DOB: June 13, 1995  PCP: System, Pcp Not In  Subjective:  Pt is a 21 year old female presents to clinic for TB test. 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 She has been seen multiple times here for these issues.  Today she asked to not have her weight on her AVS.  She is feeling well today. TB questionnaire done by CMA   Last OV was her annual exam on 9/17.  She was seen here for anxiety 12/29/16: "She reports that she has gained some weight though does not know how much she weighs or if wants to know.  Her parents are looking into an inpatient eating disorder treatment.  She may skip a semester to pursue this.   Currently, she lives with her boyfriend.  She notes that she will talk to  Him if she has depressive symptoms.  She will also talk to her parents whom she states that she has a good relationship with, or her friend Claris Che.  She has no plan of SI.  But struggles with thoughts, though she reports "there no more abnormal than anyone else".  Therapist - Doctor, general practice counseling - generalized anxiety - feels like it is helping - they know about eating disorder but that is not what they are working on Nutritionist - has not seen in a long time  Eating is about the same - she is currently not receiving treatment for this disorder - still restricts - eats small amounts of food few times a day - does not like eating around people"  Review of Systems  Constitutional: Negative for chills, diaphoresis, fatigue and fever.  HENT: Negative for congestion, postnasal drip, rhinorrhea, sinus pressure, sneezing and sore throat.   Respiratory: Negative for cough, chest tightness, shortness of breath and wheezing.   Cardiovascular: Negative for chest pain and palpitations.  Gastrointestinal: Negative for abdominal pain, diarrhea, nausea and vomiting.  Neurological:  Negative for weakness, light-headedness and headaches.  Psychiatric/Behavioral: Negative for sleep disturbance.    Patient Active Problem List   Diagnosis Date Noted  . 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 Anorexia nervosa 02/28/2015  . Anxiety 02/28/2015  . Insomnia 02/28/2015  . Hemorrhagic ovarian cyst 09/07/2014    Current Outpatient Prescriptions on File Prior to Visit  Medication Sig Dispense Refill  . cyclobenzaprine (FLEXERIL) 5 MG tablet Take 1 tablet (5 mg total) by mouth 3 (three) times daily as needed for muscle spasms. 30 tablet 0  . escitalopram (LEXAPRO) 20 MG tablet TAKE ONE AND ONE-HALF TABLET (30 MG TOTAL) BY MOUTH DAILY. 135 tablet 5   No current facility-administered medications on file prior to visit.     Allergies  Allergen Reactions  . Codeine Nausea And Vomiting  . Hydrocodone Nausea And Vomiting     Objective:  BP 109/70   Pulse 99   Temp 98.6 F (37 C) (Oral)   Resp 16   Ht 5' 4.5" (1.638 m)   LMP 02/17/2017   SpO2 98%   Physical Exam  Constitutional: She is oriented to person, place, and time and well-developed, well-nourished, and in no distress. No distress.  Cardiovascular: Normal rate, regular rhythm and normal heart sounds.   Pulmonary/Chest: Effort normal and breath sounds normal. She has no wheezes. She has no rales.  Neurological: She is alert and oriented to person, place, and time. GCS score is 15.  Skin: Skin is  warm and dry.  Psychiatric: Mood, memory, affect and judgment normal.  Vitals reviewed.   Assessment and Plan :  1. Screening-pulmonary TB - TB Skin Test - RTC in 48-72 hours for TB reading.  2. Body dysmorphic disorder - Pt plans to check into inpatient treatment center soon.    Marco Collie, PA-C  [facility] at [facility] 02/23/2017 2:51 PM

## 2017-02-23 NOTE — Progress Notes (Signed)
  Tuberculosis Risk Questionnaire  1. No Were you born outside the [place] in one of the following parts of the world: [place], [place], [place], [place] or [place]?    2. No Have you traveled outside the [place] and lived for more than one month in one of the following parts of the world: [place], [place], [place], [place] or [place]?    3. No Do you have a compromised immune system such as from any of the following conditions:HIV/AIDS, organ or bone marrow transplantation, diabetes, immunosuppressive medicines (e.g. Prednisone, Remicaide), leukemia, lymphoma, cancer of the head or neck, gastrectomy or jejunal bypass, end-stage renal disease (on dialysis), or silicosis?     4. No Have you ever or do you plan on working in: a residential care center, a health care facility, a jail or prison or homeless shelter?    5. No Have you ever: injected illegal drugs, used crack cocaine, lived in a homeless shelter  or been in jail or prison?     6. No Have you ever been exposed to anyone with infectious tuberculosis?  7. No Have you ever had a BCG vaccine? (BCG is a vaccine for tuberculosis  (TB) used in OTHER countries, NOT in the [place]).  8. No Have you ever been advised by a health care provider NOT to have a TB skin test?  9. No Have you ever had a POSITIVE TB skin test?  IF SO, when?   IF SO, were you treated with INH?  IF SO, where? Tuberculosis Symptom Questionnaire  Do you currently have any of the following symptoms?  1. No Unexplained cough lasting more than 3 weeks?   2. No Unexplained fever lasting more than 3 weeks.   3. No Night Sweats (sweating that leaves the bedclothes and sheets wet)     4. No Shortness of Breath   5. No Chest Pain   6. No Unintentional weight loss    7. No Unexplained fatigue (very tired for no reason)   

## 2017-02-25 ENCOUNTER — Ambulatory Visit: Payer: BLUE CROSS/BLUE SHIELD | Admitting: Family Medicine

## 2017-02-25 LAB — TOXASSURE SELECT 13 (MW), URINE

## 2017-03-01 ENCOUNTER — Telehealth: Payer: Self-pay | Admitting: Physician Assistant

## 2017-03-01 LAB — TB SKIN TEST
Induration: 0 mm
TB Skin Test: NEGATIVE

## 2017-03-01 NOTE — Telephone Encounter (Signed)
Please contact patient for TB test.  Did she have this read? She is trying to get in-patient for treatment of anorexia nervosa.  The ppd skin test reading was the only thing she was waiting for.  Burna Mortimer attempted to contact her this morning, and she did not pick up.   After this is done, we can send this off with the ppd report.  The paperwork is in my box.  She may need to get the ppd test redone, which she can get a nurse visit.  Once this reading is completed it needs to be faxed asap.

## 2017-03-02 NOTE — Telephone Encounter (Signed)
Call went straight to voicemail. VM not set up.

## 2017-03-02 NOTE — Telephone Encounter (Signed)
TB negative on 9/26

## 2017-03-03 NOTE — Telephone Encounter (Signed)
The forms for this were given to you 2 days ago, was this faxed over, to [facility]?  I need to confirm.

## 2017-03-04 NOTE — Telephone Encounter (Signed)
[facility] called asking for admissions paperwork for patient. Paperwork faxed.

## 2017-03-04 NOTE — Telephone Encounter (Signed)
Please contact patient and advise that this was sent and call the [facility] to confirm that they have these forms

## 2017-03-04 NOTE — Telephone Encounter (Signed)
I just faxed them this morning. Awaiting confirmation

## 2017-03-04 NOTE — Telephone Encounter (Signed)
Attempted to contact patient but VM is not set up yet. [facility] but no answer, left message confirming that they received the paperwork and to call back.

## 2017-03-23 IMAGING — US US TRANSVAGINAL NON-OB
1 series · 13 of 25 positions shown · non-contrast
Comparison: CT of the abdomen and pelvis performed earlier today at

CLINICAL DATA: Acute onset of right lower quadrant abdominal pain
and chronic amenorrhea. Leukocytosis. Initial encounter.

EXAM:
TRANSABDOMINAL AND TRANSVAGINAL ULTRASOUND OF PELVIS
TECHNIQUE: Both transabdominal and transvaginal ultrasound examinations of the
pelvis were performed. Transabdominal technique was performed for
global imaging of the pelvis including uterus, ovaries, adnexal
regions, and pelvic cul-de-sac. It was necessary to proceed with
endovaginal exam following the transabdominal exam to visualize the
uterus and ovaries in greater detail.

[Series 1: us pelvis complete · 13 of 74 slices shown]
[im 1/74]
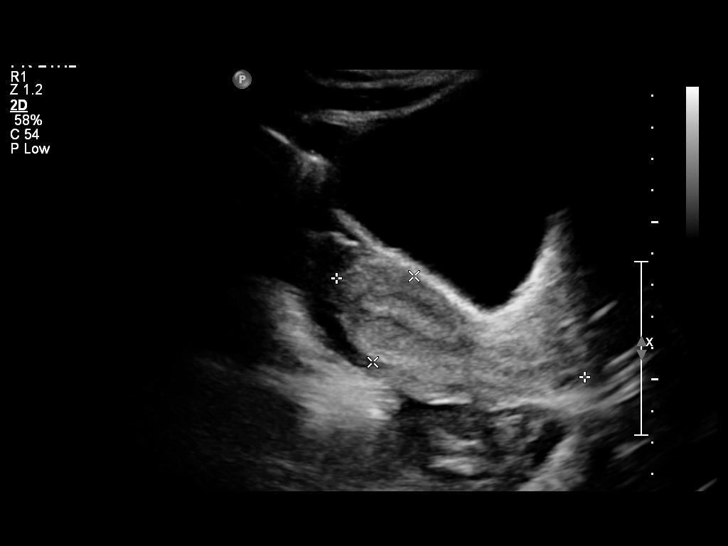
[im 7/74]
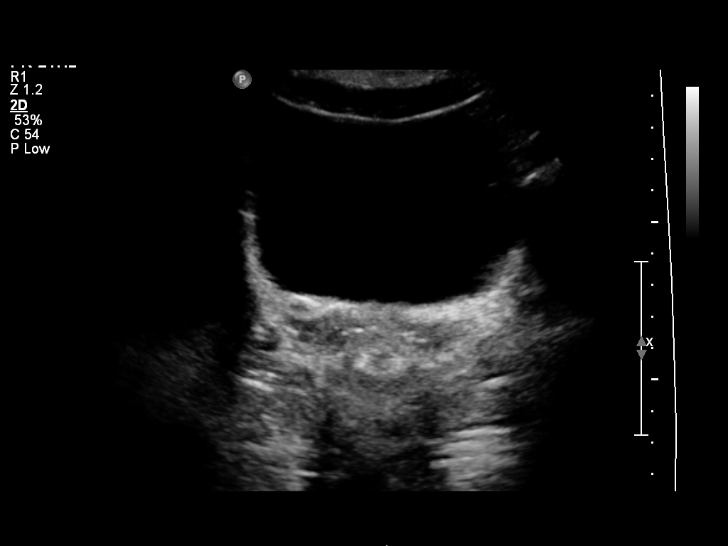
[im 13/74]
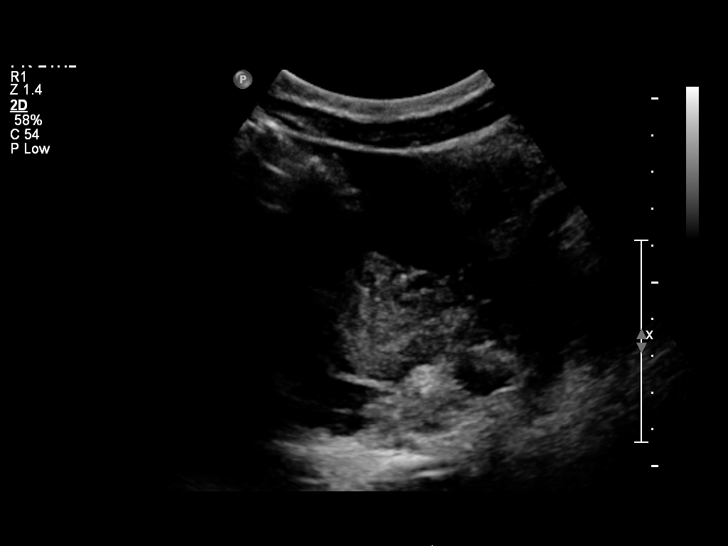
[im 19/74]
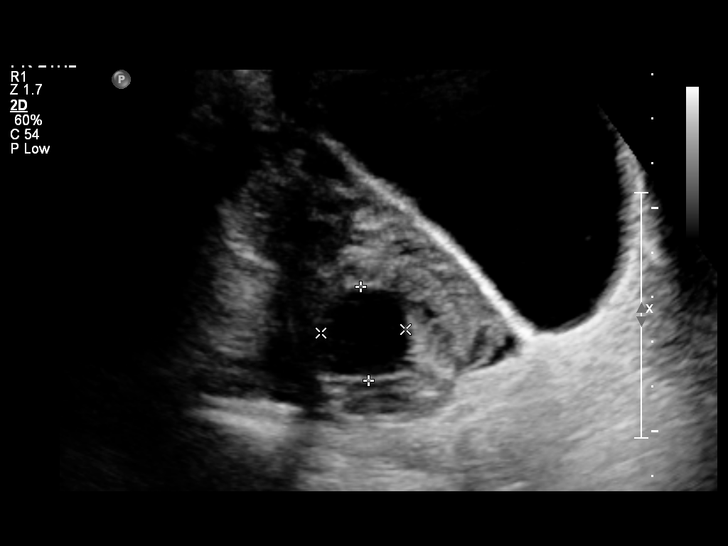
[im 25/74]
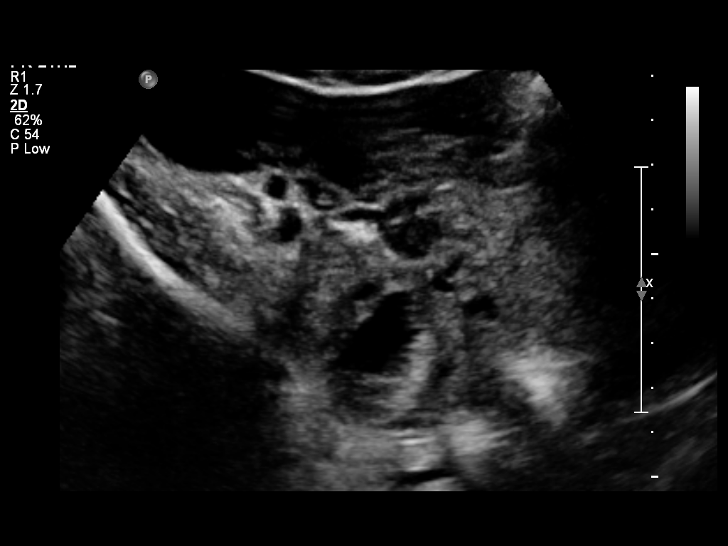
[im 31/74]
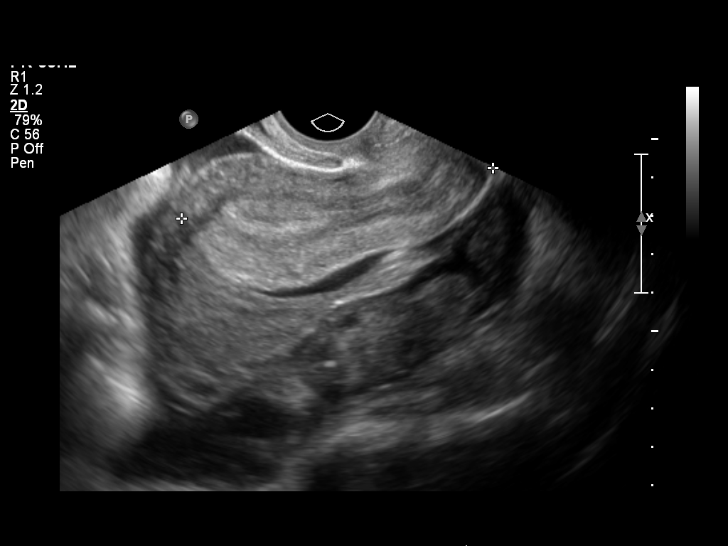
[im 37/74]
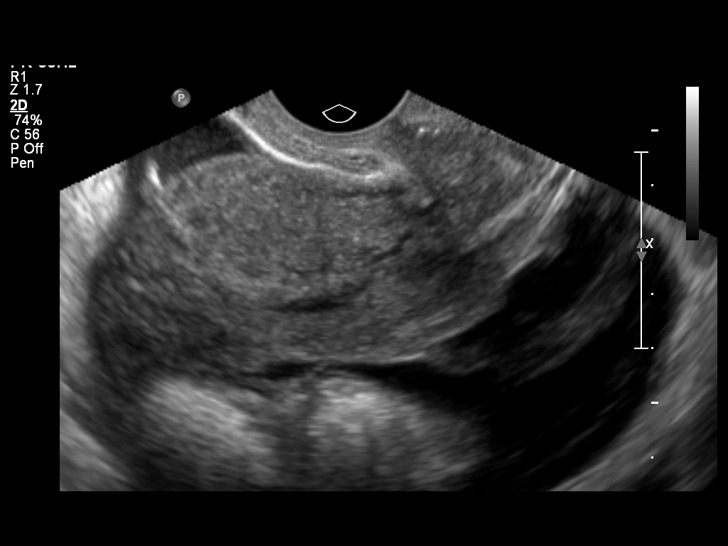
[im 43/74]
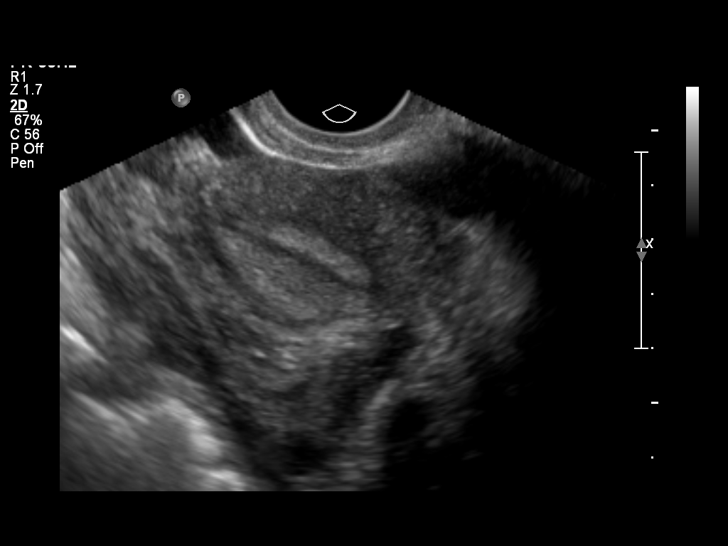
[im 49/74]
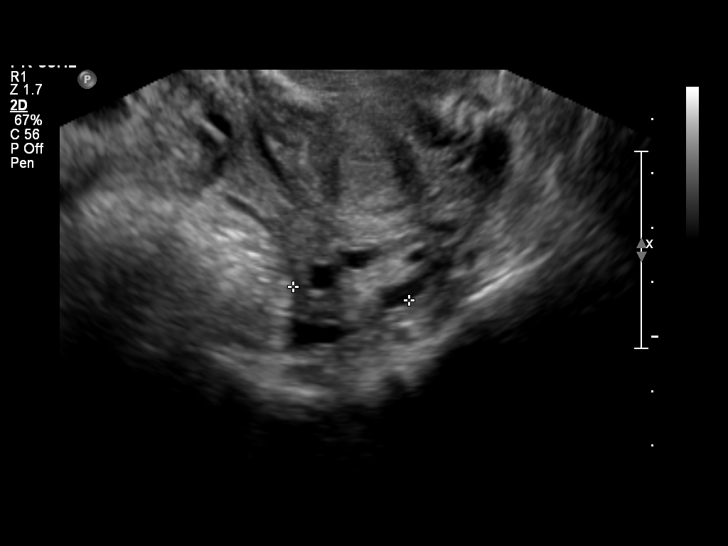
[im 55/74]
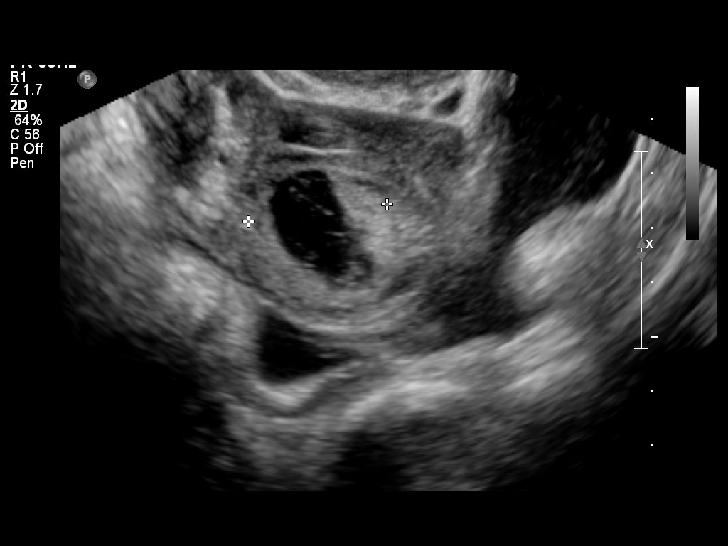
[im 61/74]
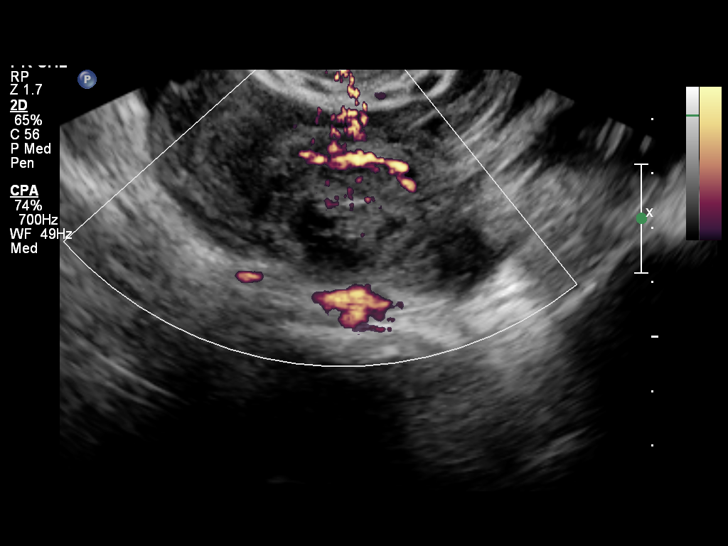
[im 67/74]
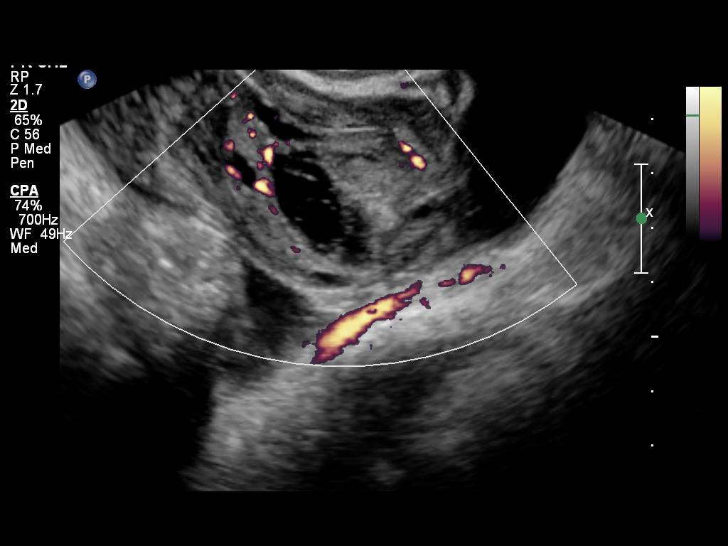
[im 74/74]
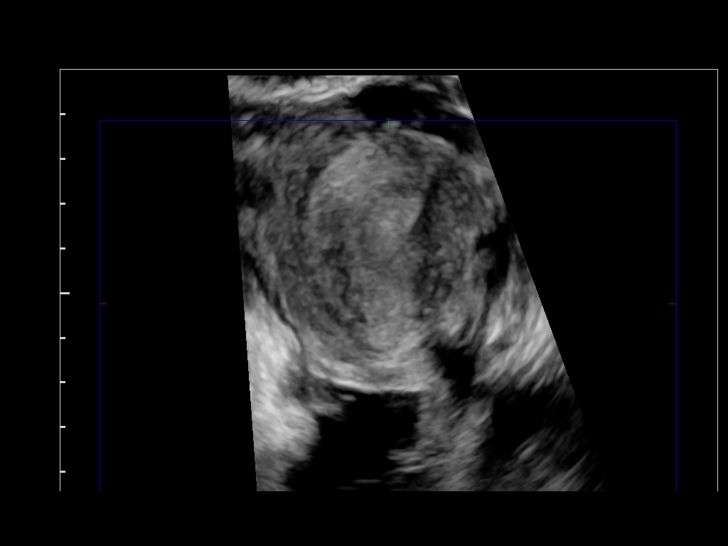

[13 of 25 positions shown; findings below may reference images not displayed]

FINDINGS: Uterus

Measurements: 8.2 x 3.5 x 4.1 cm. No fibroids or other mass
visualized.

Endometrium

Thickness: 0.9 cm.  No focal abnormality visualized.

Right ovary

Measurements: 4.4 x 3.0 x 3.8 cm. A 3.0 cm somewhat complex cyst is
noted at the right ovary, with a mildly lace-like pattern, possibly
reflecting a hemorrhagic cyst.

Left ovary

Measurements: 3.3 x 2.0 x 2.1 cm. Normal appearance/no adnexal mass.

Other findings

A large amount of complex fluid is noted within the pelvis, with
several areas of clot seen.
IMPRESSION: Large amount of complex fluid again noted within the pelvis,
concerning for blood, with several areas of clot noted. Somewhat
complex 3.0 cm cyst at the right ovary may reflect a hemorrhagic
cyst. This could reflect rupture of a hemorrhagic cyst; the amount
of visualized blood is rather large for a hemorrhagic cyst.

These results were called by telephone at the time of interpretation
acknowledged these results.

## 2017-09-07 ENCOUNTER — Encounter: Payer: Self-pay | Admitting: Physician Assistant

## 2019-04-25 ENCOUNTER — Encounter: Payer: BLUE CROSS/BLUE SHIELD | Admitting: Adult Health Nurse Practitioner

## 2019-04-26 ENCOUNTER — Encounter: Payer: Self-pay | Admitting: Adult Health Nurse Practitioner
